# Patient Record
Sex: Female | Born: 1990 | Hispanic: Yes | Marital: Married | Smoking: Never smoker
Health system: Southern US, Community
[De-identification: ages and names within clinical notes are randomized; demographics above are authoritative.]

## PROBLEM LIST (undated history)

## (undated) ENCOUNTER — Inpatient Hospital Stay (HOSPITAL_COMMUNITY): Payer: Self-pay

## (undated) DIAGNOSIS — K37 Unspecified appendicitis: Secondary | ICD-10-CM

## (undated) DIAGNOSIS — F32A Depression, unspecified: Secondary | ICD-10-CM

## (undated) DIAGNOSIS — O009 Unspecified ectopic pregnancy without intrauterine pregnancy: Secondary | ICD-10-CM

## (undated) DIAGNOSIS — F329 Major depressive disorder, single episode, unspecified: Secondary | ICD-10-CM

---

## 2013-07-03 ENCOUNTER — Observation Stay (HOSPITAL_COMMUNITY)
Admission: EM | Admit: 2013-07-03 | Discharge: 2013-07-04 | Disposition: A | Discharge status: Home / Self Care | Payer: MEDICAID | Attending: General Surgery | Admitting: General Surgery

## 2013-07-03 ENCOUNTER — Encounter (HOSPITAL_COMMUNITY): Payer: Self-pay | Admitting: *Deleted

## 2013-07-03 ENCOUNTER — Emergency Department (HOSPITAL_COMMUNITY): Payer: Self-pay

## 2013-07-03 ENCOUNTER — Observation Stay (HOSPITAL_COMMUNITY): Payer: Self-pay | Admitting: Certified Registered"

## 2013-07-03 ENCOUNTER — Encounter (HOSPITAL_COMMUNITY)
Admission: EM | Disposition: A | Discharge status: Home / Self Care | Payer: Self-pay | Source: Home / Self Care | Attending: Emergency Medicine

## 2013-07-03 ENCOUNTER — Encounter (HOSPITAL_COMMUNITY): Payer: Self-pay | Admitting: Certified Registered"

## 2013-07-03 DIAGNOSIS — K3580 Unspecified acute appendicitis: Secondary | ICD-10-CM

## 2013-07-03 DIAGNOSIS — R1031 Right lower quadrant pain: Secondary | ICD-10-CM | POA: Insufficient documentation

## 2013-07-03 HISTORY — PX: LAPAROSCOPIC APPENDECTOMY: SHX408

## 2013-07-03 LAB — COMPREHENSIVE METABOLIC PANEL
ALT: 9 U/L (ref 0–35)
AST: 16 U/L (ref 0–37)
Alkaline Phosphatase: 62 U/L (ref 39–117)
CO2: 25 mEq/L (ref 19–32)
Calcium: 9.2 mg/dL (ref 8.4–10.5)
Chloride: 100 mEq/L (ref 96–112)
GFR calc Af Amer: >90 mL/min (ref >90)
GFR calc non Af Amer: >90 mL/min (ref >90)
Glucose, Bld: 97 mg/dL (ref 70–99)
Sodium: 136 mEq/L (ref 135–145)
Total Bilirubin: 0.3 mg/dL (ref 0.3–1.2)

## 2013-07-03 LAB — URINE MICROSCOPIC-ADD ON

## 2013-07-03 LAB — URINALYSIS, ROUTINE W REFLEX MICROSCOPIC
Bilirubin Urine: NEGATIVE
Glucose, UA: NEGATIVE mg/dL
Hgb urine dipstick: NEGATIVE
Ketones, ur: NEGATIVE mg/dL
Protein, ur: NEGATIVE mg/dL
Urobilinogen, UA: 1 mg/dL (ref 0.0–1.0)

## 2013-07-03 LAB — CBC WITH DIFFERENTIAL/PLATELET
Eosinophils Relative: 1 % (ref 0–5)
HCT: 40.1 % (ref 36.0–46.0)
Lymphocytes Relative: 14 % (ref 12–46)
Lymphs Abs: 1.7 10*3/uL (ref 0.7–4.0)
MCV: 86.4 fL (ref 78.0–100.0)
Neutro Abs: 9.6 10*3/uL — ABNORMAL HIGH (ref 1.7–7.7)
Platelets: 255 10*3/uL (ref 150–400)
RBC: 4.64 MIL/uL (ref 3.87–5.11)
WBC: 12.1 10*3/uL — ABNORMAL HIGH (ref 4.0–10.5)

## 2013-07-03 SURGERY — APPENDECTOMY, LAPAROSCOPIC
Anesthesia: General | Site: Abdomen | Wound class: Clean Contaminated

## 2013-07-03 MED ORDER — ROCURONIUM BROMIDE 100 MG/10ML IV SOLN
INTRAVENOUS | Status: DC | PRN
  Administered 2013-07-03: 30 mg via INTRAVENOUS

## 2013-07-03 MED ORDER — HYDROMORPHONE HCL PF 1 MG/ML IJ SOLN
0.2500 mg | INTRAMUSCULAR | Status: DC | PRN
  Administered 2013-07-03: 0.5 mg via INTRAVENOUS

## 2013-07-03 MED ORDER — OXYCODONE HCL 5 MG PO TABS
5.0000 mg | ORAL_TABLET | ORAL | Status: DC | PRN
Start: 2013-07-03 — End: 2013-07-04

## 2013-07-03 MED ORDER — FENTANYL CITRATE 0.05 MG/ML IJ SOLN
50.0000 ug | Freq: Once | INTRAMUSCULAR | Status: AC
  Administered 2013-07-03: 50 ug via INTRAVENOUS
  Filled 2013-07-03: qty 2

## 2013-07-03 MED ORDER — DIPHENHYDRAMINE HCL 50 MG/ML IJ SOLN: 12.5000 mg | Freq: Four times a day (QID) | INTRAMUSCULAR | Status: DC | PRN

## 2013-07-03 MED ORDER — IOHEXOL 300 MG/ML  SOLN
25.0000 mL | INTRAMUSCULAR | Status: DC
Start: 2013-07-03 — End: 2013-07-03
  Administered 2013-07-03: 25 mL via ORAL

## 2013-07-03 MED ORDER — NEOSTIGMINE METHYLSULFATE 1 MG/ML IJ SOLN
INTRAMUSCULAR | Status: DC | PRN
  Administered 2013-07-03: 4 mg via INTRAVENOUS

## 2013-07-03 MED ORDER — PROPOFOL 10 MG/ML IV BOLUS
INTRAVENOUS | Status: DC | PRN
  Administered 2013-07-03: 170 mg via INTRAVENOUS

## 2013-07-03 MED ORDER — KCL IN DEXTROSE-NACL 20-5-0.45 MEQ/L-%-% IV SOLN
INTRAVENOUS | Status: DC
  Administered 2013-07-03 – 2013-07-04 (×2): via INTRAVENOUS
  Filled 2013-07-03 (×3): qty 1000

## 2013-07-03 MED ORDER — FENTANYL CITRATE 0.05 MG/ML IJ SOLN
INTRAMUSCULAR | Status: DC | PRN
Start: 2013-07-03 — End: 2013-07-03
  Administered 2013-07-03: 100 ug via INTRAVENOUS
  Administered 2013-07-03 (×2): 50 ug via INTRAVENOUS

## 2013-07-03 MED ORDER — ONDANSETRON HCL 4 MG/2ML IJ SOLN: 4.0000 mg | Freq: Once | INTRAMUSCULAR | Status: DC | PRN

## 2013-07-03 MED ORDER — IOHEXOL 300 MG/ML  SOLN
100.0000 mL | Freq: Once | INTRAMUSCULAR | Status: AC | PRN
  Administered 2013-07-03: 100 mL via INTRAVENOUS

## 2013-07-03 MED ORDER — ONDANSETRON HCL 4 MG/2ML IJ SOLN
4.0000 mg | Freq: Four times a day (QID) | INTRAMUSCULAR | Status: DC | PRN
  Administered 2013-07-03: 4 mg via INTRAVENOUS

## 2013-07-03 MED ORDER — KETOROLAC TROMETHAMINE 30 MG/ML IJ SOLN
INTRAMUSCULAR | Status: DC | PRN
  Administered 2013-07-03: 30 mg via INTRAVENOUS

## 2013-07-03 MED ORDER — CEFAZOLIN SODIUM 1-5 GM-% IV SOLN
INTRAVENOUS | Status: AC
  Administered 2013-07-03 (×2): 1 g via INTRAVENOUS
  Filled 2013-07-03: qty 50

## 2013-07-03 MED ORDER — HYDROMORPHONE HCL PF 1 MG/ML IJ SOLN
INTRAMUSCULAR | Status: AC
  Administered 2013-07-03: 0.5 mg via INTRAVENOUS
  Filled 2013-07-03: qty 1

## 2013-07-03 MED ORDER — OXYCODONE HCL 5 MG/5ML PO SOLN: 5.0000 mg | Freq: Once | ORAL | Status: DC | PRN

## 2013-07-03 MED ORDER — CEFAZOLIN SODIUM 1-5 GM-% IV SOLN
1.0000 g | Freq: Four times a day (QID) | INTRAVENOUS | Status: DC
  Filled 2013-07-03 (×2): qty 50

## 2013-07-03 MED ORDER — ACETAMINOPHEN 650 MG RE SUPP: 650.0000 mg | Freq: Four times a day (QID) | RECTAL | Status: DC | PRN

## 2013-07-03 MED ORDER — DEXAMETHASONE SODIUM PHOSPHATE 4 MG/ML IJ SOLN
INTRAMUSCULAR | Status: DC | PRN
  Administered 2013-07-03: 4 mg via INTRAVENOUS

## 2013-07-03 MED ORDER — ONDANSETRON HCL 4 MG/2ML IJ SOLN
4.0000 mg | Freq: Once | INTRAMUSCULAR | Status: AC
  Administered 2013-07-03: 4 mg via INTRAVENOUS
  Filled 2013-07-03: qty 2

## 2013-07-03 MED ORDER — CEFAZOLIN SODIUM 1-5 GM-% IV SOLN
1.0000 g | Freq: Once | INTRAVENOUS | Status: AC
  Administered 2013-07-03: 1 g via INTRAVENOUS
  Filled 2013-07-03: qty 50

## 2013-07-03 MED ORDER — MORPHINE SULFATE 2 MG/ML IJ SOLN: 1.0000 mg | INTRAMUSCULAR | Status: DC | PRN

## 2013-07-03 MED ORDER — SODIUM CHLORIDE 0.9 % IV BOLUS (SEPSIS)
1000.0000 mL | Freq: Once | INTRAVENOUS | Status: AC
  Administered 2013-07-03: 1000 mL via INTRAVENOUS

## 2013-07-03 MED ORDER — CEFAZOLIN SODIUM 1-5 GM-% IV SOLN
INTRAVENOUS | Status: AC
  Filled 2013-07-03: qty 50

## 2013-07-03 MED ORDER — KETOROLAC TROMETHAMINE 30 MG/ML IJ SOLN: 15.0000 mg | Freq: Once | INTRAMUSCULAR | Status: DC | PRN

## 2013-07-03 MED ORDER — SODIUM CHLORIDE 0.9 % IR SOLN
Status: DC | PRN
  Administered 2013-07-03: 1000 mL

## 2013-07-03 MED ORDER — GLYCOPYRROLATE 0.2 MG/ML IJ SOLN
INTRAMUSCULAR | Status: DC | PRN
  Administered 2013-07-03: 0.6 mg via INTRAVENOUS

## 2013-07-03 MED ORDER — HYDROMORPHONE HCL PF 1 MG/ML IJ SOLN
1.0000 mg | Freq: Once | INTRAMUSCULAR | Status: AC
  Administered 2013-07-03: 1 mg via INTRAVENOUS
  Filled 2013-07-03: qty 1

## 2013-07-03 MED ORDER — ACETAMINOPHEN 325 MG PO TABS: 650.0000 mg | ORAL_TABLET | Freq: Four times a day (QID) | ORAL | Status: DC | PRN

## 2013-07-03 MED ORDER — METRONIDAZOLE IN NACL 5-0.79 MG/ML-% IV SOLN
500.0000 mg | Freq: Three times a day (TID) | INTRAVENOUS | Status: DC
  Administered 2013-07-03 – 2013-07-04 (×2): 500 mg via INTRAVENOUS
  Filled 2013-07-03 (×4): qty 100

## 2013-07-03 MED ORDER — OXYCODONE HCL 5 MG PO TABS: 5.0000 mg | ORAL_TABLET | Freq: Once | ORAL | Status: DC | PRN

## 2013-07-03 MED ORDER — HYDROMORPHONE HCL PF 1 MG/ML IJ SOLN: 0.2500 mg | INTRAMUSCULAR | Status: DC | PRN

## 2013-07-03 MED ORDER — SUCCINYLCHOLINE CHLORIDE 20 MG/ML IJ SOLN
INTRAMUSCULAR | Status: DC | PRN
  Administered 2013-07-03: 100 mg via INTRAVENOUS

## 2013-07-03 MED ORDER — ONDANSETRON HCL 4 MG/2ML IJ SOLN
4.0000 mg | Freq: Four times a day (QID) | INTRAMUSCULAR | Status: DC | PRN
  Filled 2013-07-03: qty 2

## 2013-07-03 MED ORDER — CEFAZOLIN SODIUM 1-5 GM-% IV SOLN
1.0000 g | Freq: Four times a day (QID) | INTRAVENOUS | Status: DC
  Administered 2013-07-04 (×2): 1 g via INTRAVENOUS
  Filled 2013-07-03 (×3): qty 50

## 2013-07-03 MED ORDER — LIDOCAINE HCL 4 % MT SOLN
OROMUCOSAL | Status: DC | PRN
  Administered 2013-07-03: 4 mL via TOPICAL

## 2013-07-03 MED ORDER — METRONIDAZOLE IN NACL 5-0.79 MG/ML-% IV SOLN
500.0000 mg | Freq: Once | INTRAVENOUS | Status: AC
  Administered 2013-07-03: 500 mg via INTRAVENOUS
  Filled 2013-07-03: qty 100

## 2013-07-03 MED ORDER — KCL IN DEXTROSE-NACL 20-5-0.45 MEQ/L-%-% IV SOLN
INTRAVENOUS | Status: DC
  Filled 2013-07-03 (×3): qty 1000

## 2013-07-03 MED ORDER — MIDAZOLAM HCL 5 MG/5ML IJ SOLN
INTRAMUSCULAR | Status: DC | PRN
  Administered 2013-07-03: 2 mg via INTRAVENOUS

## 2013-07-03 MED ORDER — PROMETHAZINE HCL 25 MG/ML IJ SOLN
6.2500 mg | INTRAMUSCULAR | Status: DC | PRN
  Filled 2013-07-03: qty 1

## 2013-07-03 MED ORDER — ONDANSETRON HCL 4 MG/2ML IJ SOLN
INTRAMUSCULAR | Status: DC | PRN
  Administered 2013-07-03: 4 mg via INTRAVENOUS

## 2013-07-03 MED ORDER — LACTATED RINGERS IV SOLN
INTRAVENOUS | Status: DC | PRN
  Administered 2013-07-03 (×2): via INTRAVENOUS

## 2013-07-03 MED ORDER — BUPIVACAINE-EPINEPHRINE 0.25% -1:200000 IJ SOLN
INTRAMUSCULAR | Status: DC | PRN
  Administered 2013-07-03: 10 mL

## 2013-07-03 MED ORDER — DIPHENHYDRAMINE HCL 12.5 MG/5ML PO ELIX: 12.5000 mg | ORAL_SOLUTION | Freq: Four times a day (QID) | ORAL | Status: DC | PRN

## 2013-07-03 MED ORDER — ALBUMIN HUMAN 5 % IV SOLN
INTRAVENOUS | Status: DC | PRN
  Administered 2013-07-03: 17:00:00 via INTRAVENOUS

## 2013-07-03 MED ORDER — PHENYLEPHRINE HCL 10 MG/ML IJ SOLN
INTRAMUSCULAR | Status: DC | PRN
  Administered 2013-07-03: 120 ug via INTRAVENOUS

## 2013-07-03 SURGICAL SUPPLY — 38 items
APPLIER CLIP ROT 10 11.4 M/L (STAPLE)
CANISTER SUCTION 2500CC (MISCELLANEOUS) ×2 IMPLANT
CHLORAPREP W/TINT 26ML (MISCELLANEOUS) ×2 IMPLANT
CLIP APPLIE ROT 10 11.4 M/L (STAPLE) IMPLANT
CLOTH BEACON ORANGE TIMEOUT ST (SAFETY) ×2 IMPLANT
COVER SURGICAL LIGHT HANDLE (MISCELLANEOUS) ×2 IMPLANT
CUTTER FLEX LINEAR 45M (STAPLE) ×2 IMPLANT
DERMABOND ADVANCED (GAUZE/BANDAGES/DRESSINGS) ×1
DERMABOND ADVANCED .7 DNX12 (GAUZE/BANDAGES/DRESSINGS) ×1 IMPLANT
ELECT REM PT RETURN 9FT ADLT (ELECTROSURGICAL) ×2
ELECTRODE REM PT RTRN 9FT ADLT (ELECTROSURGICAL) ×1 IMPLANT
GLOVE BIO SURGEON STRL SZ 6.5 (GLOVE) ×4 IMPLANT
GLOVE BIO SURGEON STRL SZ7 (GLOVE) ×2 IMPLANT
GLOVE BIOGEL PI IND STRL 6.5 (GLOVE) ×4 IMPLANT
GLOVE BIOGEL PI IND STRL 7.5 (GLOVE) ×1 IMPLANT
GLOVE BIOGEL PI INDICATOR 6.5 (GLOVE) ×4
GLOVE BIOGEL PI INDICATOR 7.5 (GLOVE) ×1
GLOVE SURG SS PI 6.0 STRL IVOR (GLOVE) ×2 IMPLANT
GOWN STRL NON-REIN LRG LVL3 (GOWN DISPOSABLE) ×8 IMPLANT
KIT BASIN OR (CUSTOM PROCEDURE TRAY) ×2 IMPLANT
KIT ROOM TURNOVER OR (KITS) ×2 IMPLANT
NS IRRIG 1000ML POUR BTL (IV SOLUTION) ×2 IMPLANT
PAD ARMBOARD 7.5X6 YLW CONV (MISCELLANEOUS) ×4 IMPLANT
POUCH SPECIMEN RETRIEVAL 10MM (ENDOMECHANICALS) ×2 IMPLANT
RELOAD 45 VASCULAR/THIN (ENDOMECHANICALS) ×2 IMPLANT
RELOAD STAPLE TA45 3.5 REG BLU (ENDOMECHANICALS) ×2 IMPLANT
SCALPEL HARMONIC ACE (MISCELLANEOUS) ×2 IMPLANT
SCISSORS LAP 5X35 DISP (ENDOMECHANICALS) IMPLANT
SET IRRIG TUBING LAPAROSCOPIC (IRRIGATION / IRRIGATOR) ×2 IMPLANT
SLEEVE ENDOPATH XCEL 5M (ENDOMECHANICALS) ×2 IMPLANT
SPECIMEN JAR SMALL (MISCELLANEOUS) ×2 IMPLANT
SUT MNCRL AB 4-0 PS2 18 (SUTURE) ×4 IMPLANT
TOWEL OR 17X24 6PK STRL BLUE (TOWEL DISPOSABLE) ×2 IMPLANT
TOWEL OR 17X26 10 PK STRL BLUE (TOWEL DISPOSABLE) ×2 IMPLANT
TRAY FOLEY CATH 16FRSI W/METER (SET/KITS/TRAYS/PACK) ×2 IMPLANT
TRAY LAPAROSCOPIC (CUSTOM PROCEDURE TRAY) ×2 IMPLANT
TROCAR XCEL BLUNT TIP 100MML (ENDOMECHANICALS) ×2 IMPLANT
TROCAR XCEL NON-BLD 5MMX100MML (ENDOMECHANICALS) ×2 IMPLANT

## 2013-07-03 NOTE — Anesthesia Preprocedure Evaluation (Addendum)
Anesthesia Evaluation  Patient identified by MRN, date of birth, ID band Patient awake and Patient confused    Reviewed: Allergy & Precautions, H&P , NPO status , Patient's Chart, lab work & pertinent test results  Airway Mallampati: I      Dental  (+) Teeth Intact and Dental Advisory Given   Pulmonary neg pulmonary ROS,  breath sounds clear to auscultation        Cardiovascular negative cardio ROS  Rhythm:Regular Rate:Normal     Neuro/Psych negative neurological ROS  negative psych ROS   GI/Hepatic negative GI ROS, Neg liver ROS,   Endo/Other  negative endocrine ROS  Renal/GU negative Renal ROS     Musculoskeletal   Abdominal   Peds  Hematology   Anesthesia Other Findings   Reproductive/Obstetrics negative OB ROS                         Anesthesia Physical Anesthesia Plan  ASA: II and emergent  Anesthesia Plan: General   Post-op Pain Management:    Induction: Intravenous  Airway Management Planned: Oral ETT  Additional Equipment:   Intra-op Plan:   Post-operative Plan: Extubation in OR  Informed Consent: I have reviewed the patients History and Physical, chart, labs and discussed the procedure including the risks, benefits and alternatives for the proposed anesthesia with the patient or authorized representative who has indicated his/her understanding and acceptance.   Dental advisory given  Plan Discussed with: CRNA and Anesthesiologist  Anesthesia Plan Comments: (Appendicitis Speaks no english  Plan GA with oral ETT  Kipp Brood, MD)        Anesthesia Quick Evaluation

## 2013-07-03 NOTE — ED Notes (Signed)
General Surgery at bedside.

## 2013-07-03 NOTE — Anesthesia Procedure Notes (Signed)
Procedure Name: Intubation Date/Time: 07/03/2013 4:30 PM Performed by: Orvilla Fus A Pre-anesthesia Checklist: Timeout performed, Patient identified, Emergency Drugs available, Suction available and Patient being monitored Patient Re-evaluated:Patient Re-evaluated prior to inductionOxygen Delivery Method: Circle system utilized Preoxygenation: Pre-oxygenation with 100% oxygen Intubation Type: IV induction, Rapid sequence and Cricoid Pressure applied Laryngoscope Size: Mac and 3 Grade View: Grade I Tube type: Oral Tube size: 7.0 mm Number of attempts: 1 Airway Equipment and Method: Stylet and LTA kit utilized Placement Confirmation: ETT inserted through vocal cords under direct vision,  breath sounds checked- equal and bilateral and positive ETCO2 Secured at: 21 cm Tube secured with: Tape Dental Injury: Teeth and Oropharynx as per pre-operative assessment

## 2013-07-03 NOTE — H&P (Signed)
Discussed lap appy, agree with above

## 2013-07-03 NOTE — ED Provider Notes (Signed)
History    CSN: [id number] Arrival date & time 07/03/13  9562  First MD Initiated Contact with Patient 07/03/13 0815     Chief Complaint  Patient presents with  . Abdominal Pain   (Consider location/radiation/quality/duration/timing/severity/associated sxs/prior Treatment) Patient is a 22 y.o. female presenting with abdominal pain.  Abdominal Pain Associated symptoms include abdominal pain.   Level 5 caveat due to language barrier, history through telephone interpreter.  Pt reports she woke up with pain in RLQ this morning around 3:30am, has moved to RUQ, associated with vomiting. No diarrhea, no dysuria today but some burning the last few days. She does not think she is pregnant and had neg hcg at home. She denies any vaginal bleeding or discharge.   History reviewed. No pertinent past medical history. History reviewed. No pertinent past surgical history. No family history on file. History  Substance Use Topics  . Smoking status: Never Smoker   . Smokeless tobacco: Never Used  . Alcohol Use: No   OB History   Grav Para Term Preterm Abortions TAB SAB Ect Mult Living                 Review of Systems  Gastrointestinal: Positive for abdominal pain.   Unable to assess due to language barrier  Allergies  Review of patient's allergies indicates no known allergies.  Home Medications  No current outpatient prescriptions on file. BP 105/63  Pulse 90  Temp(Src) 98 F (36.7 C) (Oral)  Resp 12  SpO2 100% Physical Exam  Nursing note and vitals reviewed. Constitutional: She is oriented to person, place, and time. She appears well-developed and well-nourished.  HENT:  Head: Normocephalic and atraumatic.  Eyes: EOM are normal. Pupils are equal, round, and reactive to light.  Neck: Normal range of motion. Neck supple.  Cardiovascular: Normal rate, normal heart sounds and intact distal pulses.   Pulmonary/Chest: Effort normal and breath sounds normal.  Abdominal: Soft. Bowel  sounds are normal. She exhibits no distension. There is tenderness (RLQ). There is guarding. There is no rebound.  Musculoskeletal: Normal range of motion. She exhibits no edema and no tenderness.  Neurological: She is alert and oriented to person, place, and time. She has normal strength. No cranial nerve deficit or sensory deficit.  Skin: Skin is warm and dry. No rash noted.  Psychiatric: She has a normal mood and affect.    ED Course  Procedures (including critical care time) Labs Reviewed  CBC WITH DIFFERENTIAL - Abnormal; Notable for the following:    WBC 12.1 (*)    Neutrophils Relative % 80 (*)    Neutro Abs 9.6 (*)    All other components within normal limits  URINALYSIS, ROUTINE W REFLEX MICROSCOPIC - Abnormal; Notable for the following:    APPearance CLOUDY (*)    Leukocytes, UA LARGE (*)    All other components within normal limits  URINE MICROSCOPIC-ADD ON - Abnormal; Notable for the following:    Squamous Epithelial / LPF MANY (*)    Bacteria, UA MANY (*)    All other components within normal limits  URINE CULTURE  COMPREHENSIVE METABOLIC PANEL  LIPASE, BLOOD  PREGNANCY, URINE   Ct Abdomen Pelvis W Contrast  07/03/2013   *RADIOLOGY REPORT*  Clinical Data: Lower quadrant pain rule out appendicitis. Miscarriage 3 weeks prior.  CT ABDOMEN AND PELVIS WITH CONTRAST  Technique:  Multidetector CT imaging of the abdomen and pelvis was performed following the standard protocol during bolus administration of intravenous contrast.  Contrast: OMNIPAQUE IOHEXOL 300 MG/ML  SOLN  Comparison: None  Findings: .  BODY WALL: Unremarkable.  LOWER CHEST:  Mediastinum: Unremarkable.  Lungs/pleura: No consolidation.  ABDOMEN/PELVIS:  Liver: No focal abnormality.  Biliary: No evidence of biliary obstruction or stone.  Pancreas: Unremarkable.  Spleen: Unremarkable.  Adrenals: Unremarkable.  Kidneys and ureters: No hydronephrosis or stone.  Bladder: Unremarkable.  Bowel: The appendix is dilated  at 12 mm outer wall diameter, and has thickened wall with hyperenhancement. The neighboring peritoneal reflections are thickened.  No nonenhancing portions of the wall or periappendiceal fluid collection.  Mild, reactive enlargement of ileocolic lymph nodes.  Retroperitoneum: No mass or adenopathy.  Peritoneum: No free fluid or gas.  Reproductive: Unremarkable.  Vascular: No acute abnormality.  OSSEOUS: No acute abnormalities. No suspicious lytic or blastic lesions.  Critical Value/emergent results were called by telephone at the time of interpretation on July 03, 2013. at 11:20 am. to Dr Bernette Mayers, who verbally acknowledged these results.  IMPRESSION: Acute, non-perforated appendicitis.   Original Report Authenticated By: Tiburcio Pea   1. Acute appendicitis     MDM  CT images reviewed with radiologist. Discussed these results with patient and family using telephone interpreter. Awaiting call-back from General Surgery.   Charles B. Bernette Mayers, MD 07/03/13 1153

## 2013-07-03 NOTE — ED Notes (Signed)
Pt reports rt upper and lower abdominal pain with vomiting. Pt reports burning with urination.

## 2013-07-03 NOTE — Op Note (Signed)
Preoperative diagnosis: Acute appendicitis Postoperative diagnosis: Same as above Procedure: Laparoscopic appendectomy Surgeon: Dr. Harden Mo Anesthesia: Gen. Estimated blood loss: Minimal Specimens: Appendix to pathology Complications: None Drains: None Sponge needle count correct x2 and of operation Disposition to recovery stable  Indications: This is a 22 year old female who presents with less than 24-hour history of right upper quadrant pain and a CT scan consistent with acute appendicitis. I discussed going to the operating room via translator to perform a laparoscopic appendectomy. We discussed the risks and benefits prior to beginning.  Procedure: After informed consent was obtained the patient was taken to the operating room. She was administered cefazolin and Flagyl. Sequential compression devices were her legs. She was placed under general anesthesia without complication. A Foley catheter was placed. She was then prepped and draped in the standard sterile surgical fashion. Surgical timeout was then performed.  I injected Marcaine below umbilicus. I made a vertical incision. I grasped her fashion and entered it sharply. I entered the peritoneum bluntly. I placed a 0 Vicryl pursestring suture through the fascia and then inserted a Hassan trocar. I then insufflated the abdomen to 15 mm mercury pressure. I then inserted 2 further 5 mm ports in the left lower quadrant and suprapubic region. I then identified her cecum and terminal ileum. Her appendix was not immediately visible. I used a harmonic scalpel to take down the white line and eventually the appendix was in a retroperitoneal position tracking along the ascending colon. It was very adherent to the colon. Eventually I was able to encircle the base and I divided this with a GIA stapler. I then spent some time dissecting the appendix away from both the small bowel as well as the colon. This was done with a combination of blunt  dissection as well as the harmonic scalpel. Once I had done this the appendix was free. It was placed in an Endo Catch bag and removed from the umbilicus. My staple line looked clean. There was a was hemostatic. I irrigated and this was clear. I then viewed both the staple line, cecum, and terminal ileum and there was no evidence of an injury. I then removed mass on trocar and tied my pursestring down. This completely obliterated the defect. I then desufflated the abdomen and remove our remaining trocars. I closed these with 4 Monocryl and Dermabond. She tolerated this well was extubated and transferred to recovery stable.

## 2013-07-03 NOTE — H&P (Signed)
Ebony Ryan 12-26-1990  [id number].   Chief Complaint/Reason for Consult: acute appendicitis HPI: This is a healthy 22 yo Hispanic female who awoke this morning at 3 am with abdominal pain.  It had migrated to the RLQ.  She has had some nausea and emesis.  She admits to subjective fever at home.  She has had 2 BMs, no diarrhea.  She presented to [facility] today and had a CT scan that revealed acute appendicitis.  We were called to see the patient for admission.  Review of Systems: Please see HPI, otherwise all other systems are negative  No family history on file.  History reviewed. No pertinent past medical history.  History reviewed. No pertinent past surgical history.  Social History:  reports that she has never smoked. She has never used smokeless tobacco. She reports that she does not drink alcohol or use illicit drugs.  Allergies: No Known Allergies   (Not in a hospital admission)  Blood pressure 89/52, pulse 89, temperature 98 F (36.7 C), temperature source Oral, resp. rate 12, last menstrual period 05/25/2013, SpO2 97.00%. Physical Exam: General: pleasant, WD, WN Hispanic female who is laying in bed in NAD HEENT: head is normocephalic, atraumatic.  Sclera are noninjected.  PERRL.  Ears and nose without any masses or lesions.  Mouth is pink and moist Heart: regular, rate, and rhythm.  Normal s1,s2. No obvious murmurs, gallops, or rubs noted.  Palpable radial and pedal pulses bilaterally Lungs: CTAB, no wheezes, rhonchi, or rales noted.  Respiratory effort nonlabored Abd: soft, mild tenderness in RLQ, ND, hypoactive BS, no masses, hernias, or organomegaly MS: all 4 extremities are symmetrical with no cyanosis, clubbing, or edema. Skin: warm and dry with no masses, lesions, or rashes Psych: A&Ox3 with an appropriate affect.    Results for orders placed during the hospital encounter of 07/03/13 (from the past 48 hour(s))  URINALYSIS, ROUTINE W REFLEX MICROSCOPIC      Status: Abnormal   Collection Time    07/03/13  8:34 AM      Result Value Range   Color, Urine YELLOW  YELLOW   APPearance CLOUDY (*) CLEAR   Specific Gravity, Urine 1.024  1.005 - 1.030   pH 7.0  5.0 - 8.0   Glucose, UA NEGATIVE  NEGATIVE mg/dL   Hgb urine dipstick NEGATIVE  NEGATIVE   Bilirubin Urine NEGATIVE  NEGATIVE   Ketones, ur NEGATIVE  NEGATIVE mg/dL   Protein, ur NEGATIVE  NEGATIVE mg/dL   Urobilinogen, UA 1.0  0.0 - 1.0 mg/dL   Nitrite NEGATIVE  NEGATIVE   Leukocytes, UA LARGE (*) NEGATIVE  PREGNANCY, URINE     Status: None   Collection Time    07/03/13  8:34 AM      Result Value Range   Preg Test, Ur NEGATIVE  NEGATIVE   Comment:            THE SENSITIVITY OF THIS     METHODOLOGY IS >20 mIU/mL.  URINE MICROSCOPIC-ADD ON     Status: Abnormal   Collection Time    07/03/13  8:34 AM      Result Value Range   Squamous Epithelial / LPF MANY (*) RARE   WBC, UA 11-20  <3 WBC/hpf   RBC / HPF 0-2  <3 RBC/hpf   Bacteria, UA MANY (*) RARE   Urine-Other MUCOUS PRESENT    CBC WITH DIFFERENTIAL     Status: Abnormal   Collection Time    07/03/13  8:45 AM  Result Value Range   WBC 12.1 (*) 4.0 - 10.5 K/uL   RBC 4.64  3.87 - 5.11 MIL/uL   Hemoglobin 13.8  12.0 - 15.0 g/dL   HCT 32.9  51.8 - 84.1 %   MCV 86.4  78.0 - 100.0 fL   MCH 29.7  26.0 - 34.0 pg   MCHC 34.4  30.0 - 36.0 g/dL   RDW 66.0  63.0 - 16.0 %   Platelets 255  150 - 400 K/uL   Neutrophils Relative % 80 (*) 43 - 77 %   Neutro Abs 9.6 (*) 1.7 - 7.7 K/uL   Lymphocytes Relative 14  12 - 46 %   Lymphs Abs 1.7  0.7 - 4.0 K/uL   Monocytes Relative 6  3 - 12 %   Monocytes Absolute 0.7  0.1 - 1.0 K/uL   Eosinophils Relative 1  0 - 5 %   Eosinophils Absolute 0.1  0.0 - 0.7 K/uL   Basophils Relative 0  0 - 1 %   Basophils Absolute 0.0  0.0 - 0.1 K/uL  COMPREHENSIVE METABOLIC PANEL     Status: None   Collection Time    07/03/13  8:45 AM      Result Value Range   Sodium 136  135 - 145 mEq/L   Potassium 3.5   3.5 - 5.1 mEq/L   Chloride 100  96 - 112 mEq/L   CO2 25  19 - 32 mEq/L   Glucose, Bld 97  70 - 99 mg/dL   BUN 11  6 - 23 mg/dL   Creatinine, Ser 1.09  0.50 - 1.10 mg/dL   Calcium 9.2  8.4 - 32.3 mg/dL   Total Protein 7.8  6.0 - 8.3 g/dL   Albumin 4.1  3.5 - 5.2 g/dL   AST 16  0 - 37 U/L   ALT 9  0 - 35 U/L   Alkaline Phosphatase 62  39 - 117 U/L   Total Bilirubin 0.3  0.3 - 1.2 mg/dL   GFR calc non Af Amer >90  >90 mL/min   GFR calc Af Amer >90  >90 mL/min   Comment:            The eGFR has been calculated     using the CKD EPI equation.     This calculation has not been     validated in all clinical     situations.     eGFR's persistently     <90 mL/min signify     possible Chronic Kidney Disease.  LIPASE, BLOOD     Status: None   Collection Time    07/03/13  8:45 AM      Result Value Range   Lipase 44  11 - 59 U/L   Ct Abdomen Pelvis W Contrast  07/03/2013   *RADIOLOGY REPORT*  Clinical Data: Lower quadrant pain rule out appendicitis. Miscarriage 3 weeks prior.  CT ABDOMEN AND PELVIS WITH CONTRAST  Technique:  Multidetector CT imaging of the abdomen and pelvis was performed following the standard protocol during bolus administration of intravenous contrast.  Contrast: OMNIPAQUE IOHEXOL 300 MG/ML  SOLN  Comparison: None  Findings: .  BODY WALL: Unremarkable.  LOWER CHEST:  Mediastinum: Unremarkable.  Lungs/pleura: No consolidation.  ABDOMEN/PELVIS:  Liver: No focal abnormality.  Biliary: No evidence of biliary obstruction or stone.  Pancreas: Unremarkable.  Spleen: Unremarkable.  Adrenals: Unremarkable.  Kidneys and ureters: No hydronephrosis or stone.  Bladder: Unremarkable.  Bowel: The appendix  is dilated at 12 mm outer wall diameter, and has thickened wall with hyperenhancement. The neighboring peritoneal reflections are thickened.  No nonenhancing portions of the wall or periappendiceal fluid collection.  Mild, reactive enlargement of ileocolic lymph nodes.  Retroperitoneum:  No mass or adenopathy.  Peritoneum: No free fluid or gas.  Reproductive: Unremarkable.  Vascular: No acute abnormality.  OSSEOUS: No acute abnormalities. No suspicious lytic or blastic lesions.  Critical Value/emergent results were called by telephone at the time of interpretation on July 03, 2013. at 11:20 am. to Dr Bernette Mayers, who verbally acknowledged these results.  IMPRESSION: Acute, non-perforated appendicitis.   Original Report Authenticated By: Tiburcio Pea       Assessment/Plan 1. Acute appendicitis  Plan: 1. Will admit for surgery later today for acute appendicitis.  This has been discussed with the patient and her husband via an interpretor.  They understand and are willing to proceed.  The surgery, risks and complications, along with expected recovery has been explained.  Ancef and flagyl will be started.  Annaliz Aven E 07/03/2013, 1:41 PM Pager: [phone]

## 2013-07-03 NOTE — Transfer of Care (Signed)
Immediate Anesthesia Transfer of Care Note  Patient: Ebony Ryan  Procedure(s) Performed: Procedure(s): APPENDECTOMY LAPAROSCOPIC (N/A)  Patient Location: PACU  Anesthesia Type:General  Level of Consciousness: awake and alert   Airway & Oxygen Therapy: Patient Spontanous Breathing and Patient connected to nasal cannula oxygen  Post-op Assessment: Report given to PACU RN, Post -op Vital signs reviewed and stable and Patient moving all extremities X 4  Post vital signs: Reviewed and stable  Complications: No apparent anesthesia complications

## 2013-07-03 NOTE — ED Notes (Signed)
Patient transported to CT 

## 2013-07-04 ENCOUNTER — Encounter (HOSPITAL_COMMUNITY): Payer: Self-pay | Admitting: General Surgery

## 2013-07-04 LAB — URINE CULTURE

## 2013-07-04 MED ORDER — HYDROCODONE-ACETAMINOPHEN 5-325 MG PO TABS: 1.0000 | ORAL_TABLET | ORAL | Status: DC | PRN

## 2013-07-04 NOTE — Progress Notes (Signed)
Pt discharged to home

## 2013-07-04 NOTE — Discharge Summary (Signed)
Physician Discharge Summary  Patient ID: Ebony Ryan MRN: [id number] DOB/AGE: [birth year] 21 y.o.  Admit date: 07/03/2013 Discharge date: 07/04/2013  Discharge Diagnoses Patient Active Problem List   Diagnosis Date Noted  . Acute appendicitis 07/03/2013    Consultants None  Procedures Laparoscopic appendectomy by Dr. Harden Mo   HPI: This is a healthy 22 yo Hispanic female who awoke the morning of admission at 3 am with abdominal pain. It had migrated to the right lower quadrant. She had some nausea and emesis. She admitted to a subjective fever at home. She had 2 bowel movements but no diarrhea. She presented to the emergency department and had a CT scan that revealed acute appendicitis.    Hospital Course: The patient was taken later that day for an uncomplicated laparoscopic appendectomy. Her postoperative course was uneventful. Her pain was controlled and she was tolerating a diet. She was discharged home in improved condition.      Medication List         HYDROcodone-acetaminophen 5-325 MG per tablet  Commonly known as:  NORCO  Take 1-2 tablets by mouth every 4 (four) hours as needed for pain.     prenatal multivitamin Tabs  Take 1 tablet by mouth daily at 12 noon.             Follow-up Information   Follow up with [facility] On 07/22/2013. (12:45PM)    Contact information:   [address] [place] [address] [phone]       Signed: Freeman Caldron, PA-C Pager: [phone] General Trauma PA Pager: [phone]  07/04/2013, 8:48 AM

## 2013-07-04 NOTE — Progress Notes (Signed)
Agree with above, dc today

## 2013-07-04 NOTE — Anesthesia Postprocedure Evaluation (Signed)
Anesthesia Post Note  Patient: Ebony Ryan  Procedure(s) Performed: Procedure(s) (LRB): APPENDECTOMY LAPAROSCOPIC (N/A)  Anesthesia type: general  Patient location: PACU  Post pain: Pain level controlled  Post assessment: Patient's Cardiovascular Status Stable  Last Vitals:  Filed Vitals:   07/04/13 0555  BP: 90/49  Pulse: 50  Temp: 37.2 C  Resp: 16    Post vital signs: Reviewed and stable  Level of consciousness: sedated  Complications: No apparent anesthesia complications

## 2013-07-04 NOTE — Progress Notes (Signed)
Patient ID: Ebony Ryan, female   DOB: January 16, 1991, 22 y.o.   MRN: [id number]   LOS: 1 day  POD#1  Subjective: Denies N/V. Some pain with deep breathing.   Objective: Vital signs in last 24 hours: Temp:  [98.6 F (37 C)-99 F (37.2 C)] 99 F (37.2 C) (07/11 0555) Pulse Rate:  [50-102] 50 (07/11 0555) Resp:  [14-18] 16 (07/11 0555) BP: (85-100)/(44-61) 90/49 mmHg (07/11 0555) SpO2:  [97 %-100 %] 100 % (07/11 0555) Weight:  [138 lb 4.8 oz (62.732 kg)] 138 lb 4.8 oz (62.732 kg) (07/10 1850)    Physical Exam General appearance: alert and no distress Resp: clear to auscultation bilaterally Cardio: regular rate and rhythm GI: Soft, +BS, port sites C/D/I   Assessment/Plan: Acute appendicitis s/p lap appy -- Home today   Freeman Caldron, PA-C Pager: [phone] 07/04/2013

## 2013-07-22 ENCOUNTER — Ambulatory Visit (INDEPENDENT_AMBULATORY_CARE_PROVIDER_SITE_OTHER): Payer: Self-pay | Admitting: Internal Medicine

## 2013-07-22 ENCOUNTER — Encounter (INDEPENDENT_AMBULATORY_CARE_PROVIDER_SITE_OTHER): Payer: Self-pay | Admitting: Internal Medicine

## 2013-07-22 ENCOUNTER — Encounter (INDEPENDENT_AMBULATORY_CARE_PROVIDER_SITE_OTHER): Payer: Self-pay | Admitting: General Surgery

## 2013-07-22 VITALS — BP 92/64 | HR 64 | Temp 98.4°F | Resp 14 | Ht 63.5 in | Wt 129.8 lb

## 2013-07-22 DIAGNOSIS — K3580 Unspecified acute appendicitis: Secondary | ICD-10-CM

## 2013-07-22 NOTE — Patient Instructions (Signed)
May resume regular activity without restrictions. Follow up as needed. Call with questions or concerns.  

## 2013-07-22 NOTE — Progress Notes (Signed)
  Subjective: Pt returns to the clinic today after undergoing laparoscopic appendectomy on 07/03/13 by Dr. Dwain Sarna.  The patient is tolerating their diet well and is having no severe pain.  Bowel function is good.  No problems with the wounds.  Objective: Vital signs in last 24 hours: Reviewed  PE: Abd: soft, non-tender, +bs, incisions well healed  Lab Results:  No results found for this basename: WBC, HGB, HCT, PLT,  in the last 72 hours BMET No results found for this basename: NA, K, CL, CO2, GLUCOSE, BUN, CREATININE, CALCIUM,  in the last 72 hours PT/INR No results found for this basename: LABPROT, INR,  in the last 72 hours CMP     Component Value Date/Time   NA 136 07/03/2013 0845   K 3.5 07/03/2013 0845   CL 100 07/03/2013 0845   CO2 25 07/03/2013 0845   GLUCOSE 97 07/03/2013 0845   BUN 11 07/03/2013 0845   CREATININE 0.66 07/03/2013 0845   CALCIUM 9.2 07/03/2013 0845   PROT 7.8 07/03/2013 0845   ALBUMIN 4.1 07/03/2013 0845   AST 16 07/03/2013 0845   ALT 9 07/03/2013 0845   ALKPHOS 62 07/03/2013 0845   BILITOT 0.3 07/03/2013 0845   GFRNONAA >90 07/03/2013 0845   GFRAA >90 07/03/2013 0845   Lipase     Component Value Date/Time   LIPASE 44 07/03/2013 0845       Studies/Results: No results found.  Anti-infectives: Anti-infectives   None       Assessment/Plan  1.  S/P Laparoscopic Appendectomy: doing well, may resume regular activity without restrictions, Pt will follow up with [place] PRN and knows to call with questions or concerns.     WHITE, ELIZABETH 07/22/2013

## 2014-06-01 ENCOUNTER — Inpatient Hospital Stay (HOSPITAL_COMMUNITY)
Admission: AD | Admit: 2014-06-01 | Discharge: 2014-06-02 | Disposition: A | Discharge status: Home / Self Care | Payer: No Typology Code available for payment source | Source: Ambulatory Visit | Attending: Obstetrics and Gynecology | Admitting: Obstetrics and Gynecology

## 2014-06-01 ENCOUNTER — Encounter (HOSPITAL_COMMUNITY): Payer: Self-pay

## 2014-06-01 DIAGNOSIS — O219 Vomiting of pregnancy, unspecified: Secondary | ICD-10-CM

## 2014-06-01 DIAGNOSIS — O210 Mild hyperemesis gravidarum: Secondary | ICD-10-CM | POA: Insufficient documentation

## 2014-06-01 LAB — URINALYSIS, ROUTINE W REFLEX MICROSCOPIC
BILIRUBIN URINE: NEGATIVE
GLUCOSE, UA: NEGATIVE mg/dL
HGB URINE DIPSTICK: NEGATIVE
KETONES UR: NEGATIVE mg/dL
LEUKOCYTES UA: NEGATIVE
Nitrite: NEGATIVE
PH: 7 (ref 5.0–8.0)
Protein, ur: NEGATIVE mg/dL
Specific Gravity, Urine: 1.01 (ref 1.005–1.030)
Urobilinogen, UA: 0.2 mg/dL (ref 0.0–1.0)

## 2014-06-01 LAB — POCT PREGNANCY, URINE: Preg Test, Ur: POSITIVE — AB

## 2014-06-01 MED ORDER — PROMETHAZINE HCL 25 MG RE SUPP: 25.0000 mg | Freq: Four times a day (QID) | RECTAL | Status: DC | PRN

## 2014-06-01 MED ORDER — METOCLOPRAMIDE HCL 5 MG/ML IJ SOLN
10.0000 mg | Freq: Once | INTRAMUSCULAR | Status: AC
  Administered 2014-06-02: 10 mg via INTRAMUSCULAR
  Filled 2014-06-01: qty 2

## 2014-06-01 MED ORDER — PROMETHAZINE HCL 25 MG PO TABS: 25.0000 mg | ORAL_TABLET | Freq: Four times a day (QID) | ORAL | Status: DC | PRN

## 2014-06-01 MED ORDER — PROMETHAZINE HCL 25 MG PO TABS
25.0000 mg | ORAL_TABLET | Freq: Once | ORAL | Status: AC
  Administered 2014-06-01: 25 mg via ORAL
  Filled 2014-06-01: qty 1

## 2014-06-01 MED ORDER — METOCLOPRAMIDE HCL 10 MG PO TABS: 10.0000 mg | ORAL_TABLET | Freq: Once | ORAL | Status: DC

## 2014-06-01 NOTE — Discharge Instructions (Signed)
Náuseas matinales  ( Morning Sickness)  Se denominan náuseas matinales a las ganas de vomitar (náuseas) durante el embarazo. Comienza a sentir náuseas y devuelve (vomita). Siente náuseas por la mañana, pero puede continuar sintiéndolas durante todo el día. Algunas mujeres experimentan náuseas intensas y no pueden detener los vómitos (hiperemesis gravídica).  CUIDADOS EN EL HOGAR  · Tome sólo los medicamentos según le haya indicado el médico.  · Tome las multivitaminas como le indicó el médico. Tomar multivitaminas antes de quedar embarazada puede detener o disminuir el malestar de las náuseas matinales.  · Coma un trozo de tostada seca o crackers sin sal antes de levantarse de la cama por la mañana.  · Coma 5 o 6 comidas pequeñas por día.  · Consuma alimentos blandos y secos como arroz y patatas asadas.  · No  beba líquidos con las comidas. Tome líquidos entre las comidas.  · No coma alimentos fritos, grasos o muy condimentados.  · Pídale a alguna persona que cocine para usted, si el olor de las comidas le da náuseas o ganas de vomitar.  · Si tiene ganas de vomitar después de tomar las vitaminas prenatales, tómelas a la noche o con una colación.  · Consuma proteínas cuando necesite una colación (frutas secas, yogur, queso).  · Coma gelatina sin azúcar de postre.  · Use un brazalete de los usados para el mareo marítimo (pulsera de acupresión).  · Vaya a un especialista en colocar agujas en puntos seleccionados del cuerpo (acupuntura)para sentirse mejor.  · No fume.  · Consiga un humidificador para mantener el aire de su casa libre de olores.  · Trate de respirar aire fresco.  SOLICITE AYUDA SI:  · Necesita medicamentos para sentirse mejor.  · Se siente mareada o sufre un desmayo.  · Pierde peso.  SOLICITE AYUDA DE INMEDIATO SI:   · Tiene malestar estomacal y no puede dejar de vomitar.  · Pierde el conocimiento (se desmaya).  Document Released: 12/11/2005 Document Revised: 08/13/2013  ExitCare® Patient Information  ©2014 ExitCare, LLC.

## 2014-06-01 NOTE — MAU Provider Note (Signed)
First Provider Initiated Contact with Patient 06/01/2014 at 2200.  Chief Complaint:  Possible Pregnancy, Emesis During Pregnancy and Morning Sickness   Ebony Ryan is  23 y.o. G1P0 at [redacted]w[redacted]d presents complaining of Possible Pregnancy, Emesis During Pregnancy and Morning Sickness [facility] pt reports that she has been having nausea and vomiting for 4 weeks. Pt reports that she has 5-8 episodes of emesis. Pt reports that usually able to keep some food down. Pt reports that she is lightheaded/dizzy when she vomitting but not when standing.   Pt had a ectopic pregnancy a year ago. Pt tried imetrol? For nausea and helped a little with n/v Denies vaginal bleeding or discharge. No ctx.  Obstetrical/Gynecological History: OB History   Grav Para Term Preterm Abortions TAB SAB Ect Mult Living   2    1   1        Past Medical History: History reviewed. No pertinent past medical history.  Past Surgical History: Past Surgical History  Procedure Laterality Date  . Laparoscopic appendectomy N/A 07/03/2013    Procedure: APPENDECTOMY LAPAROSCOPIC;  Surgeon: Emelia LoronMatthew Wakefield, MD;  [facility];  Service: General;  Laterality: N/A;    Family History: History reviewed. No pertinent family history.  Social History: History  Substance Use Topics  . Smoking status: Never Smoker   . Smokeless tobacco: Never Used  . Alcohol Use: No    Allergies: No Known Allergies  Meds:  Prescriptions prior to admission  Medication Sig Dispense Refill  . anti-nausea (EMETROL) solution Take 15 mLs by mouth 2 (two) times daily.      . Multiple Vitamins-Minerals (MULTIVITAMIN PO) Take 1 tablet by mouth daily.        Review of Systems -   Review of Systems  Denies headaches, vision changes, cp, sob, occassional loose stool. No issues with urination, increased spitting after vomiting.   Physical Exam  Blood pressure 102/62, pulse 85, temperature 98.5 F (36.9  C), temperature source Oral, resp. rate 18, height 5\' 3"  (1.6 m), weight 58.514 kg (129 lb), last menstrual period 03/28/2014. GENERAL: Well-developed, well-nourished female in no acute distress.  LUNGS: Clear to auscultation bilaterally.  HEART: Regular rate and rhythm. ABDOMEN: Soft, nontender, nondistended, gravid.  EXTREMITIES: Nontender, no edema, 2+ distal pulses. DTR's 2+  Labs: Results for orders placed during the hospital encounter of 06/01/14 (from the past 24 hour(s))  URINALYSIS, ROUTINE W REFLEX MICROSCOPIC   Collection Time    06/01/14  9:35 PM      Result Value Ref Range   Color, Urine YELLOW  YELLOW   APPearance CLEAR  CLEAR   Specific Gravity, Urine 1.010  1.005 - 1.030   pH 7.0  5.0 - 8.0   Glucose, UA NEGATIVE  NEGATIVE mg/dL   Hgb urine dipstick NEGATIVE  NEGATIVE   Bilirubin Urine NEGATIVE  NEGATIVE   Ketones, ur NEGATIVE  NEGATIVE mg/dL   Protein, ur NEGATIVE  NEGATIVE mg/dL   Urobilinogen, UA 0.2  0.0 - 1.0 mg/dL   Nitrite NEGATIVE  NEGATIVE   Leukocytes, UA NEGATIVE  NEGATIVE  POCT PREGNANCY, URINE   Collection Time    06/01/14 10:13 PM      Result Value Ref Range   Preg Test, Ur POSITIVE (*) NEGATIVE   MAU COURSE:  Ryna Odom MD turned over care of pt to Ebony Ryan, CMN at 2220. Phenergan given.  Improved with phenergan, but then pt vomiting when she was being discharged.  Reglan IM ordered.   Vomiting again. IV bolus and Zofran ordered.  Feeling much better. No vomiting.    1. Nausea and vomiting of pregnancy, antepartum    Plan: Discharge him in stable condition. Advance diet slowly. Encouraged using Phenergan on a schedule for now and Zofran sparingly as needed. Discussed concerns about Zofran use in early pregnancy and weighed against concerns concerned about the patient's failure of other antiemetics. Follow-up Information   Follow up with [facility] [place]. (As Previously Scheduled)    Contact information:   [address]  [place] [address] [phone]       Medication List         anti-nausea solution  Take 15 mLs by mouth 2 (two) times daily.     famotidine 20 MG tablet  Commonly known as:  PEPCID  Take 1 tablet (20 mg total) by mouth 2 (two) times daily.     MULTIVITAMIN PO  Take 1 tablet by mouth daily.     ondansetron 4 MG tablet  Commonly known as:  ZOFRAN  Take 1 tablet (4 mg total) by mouth every 8 (eight) hours as needed for nausea or vomiting.     promethazine 25 MG tablet  Commonly known as:  PHENERGAN  Take 1 tablet (25 mg total) by mouth every 6 (six) hours as needed for nausea or vomiting.     promethazine 25 MG suppository  Commonly known as:  PHENERGAN  Place 1 suppository (25 mg total) rectally every 6 (six) hours as needed for nausea or vomiting.       Dorathy Kinsman 6/8/201510:58 PM

## 2014-06-01 NOTE — MAU Provider Note (Signed)
None     Chief Complaint:  Possible Pregnancy, Emesis During Pregnancy and Morning Sickness   Ebony Ryan is  23 y.o. G1P0 at [redacted]w[redacted]d presents complaining of Possible Pregnancy, Emesis During Pregnancy and Morning Sickness [facility] - pt reports that she has been having nausea and vomiting for 4 weeks. Pt reports that she has 5-8 episodes of emesis. Pt reports that usually able to keep some food down. Pt reports that she is lightheaded/dizzy when she vomitting but not when standing.   Pt had a ectopic pregnancy a year ago. Pt tried imetrol? For nausea and helped a little with n/v Denies vaginal bleeding or discharge. No ctx.  Obstetrical/Gynecological History: OB History   Grav Para Term Preterm Abortions TAB SAB Ect Mult Living   1              Past Medical History: No past medical history on file.  Past Surgical History: Past Surgical History  Procedure Laterality Date  . Laparoscopic appendectomy N/A 07/03/2013    Procedure: APPENDECTOMY LAPAROSCOPIC;  Surgeon: Emelia Loron, MD;  [facility] OR;  Service: General;  Laterality: N/A;    Family History: No family history on file.  Social History: History  Substance Use Topics  . Smoking status: Never Smoker   . Smokeless tobacco: Never Used  . Alcohol Use: No    Allergies: No Known Allergies  Meds:  No prescriptions prior to admission    Review of Systems -   Review of Systems  Denies headaches, vision changes, cp, sob, occassional loose stool. No issues with urination, increased spitting after vomiting.   Physical Exam  Height 5\' 3"  (1.6 m), weight 58.514 kg (129 lb), last menstrual period 03/28/2014. GENERAL: Well-developed, well-nourished female in no acute distress.  LUNGS: Clear to auscultation bilaterally.  HEART: Regular rate and rhythm. ABDOMEN: Soft, nontender, nondistended, gravid.  EXTREMITIES: Nontender, no edema, 2+ distal pulses. DTR's  2+  Labs: No results found for this or any previous visit (from the past 24 hour(s)). Imaging Studies:  No results found.  Assessment: Ebony Ryan is  23 y.o. G1P0 at [redacted]w[redacted]d presents with nausea and vomiting vs hyperemsis.  Pt given phenergan and handed over to on call provider at end of shift pending improvement.   Minta Balsam 6/8/20159:47 PM

## 2014-06-02 MED ORDER — ONDANSETRON 8 MG/NS 50 ML IVPB
8.0000 mg | Freq: Once | INTRAVENOUS | Status: AC
  Administered 2014-06-02: 8 mg via INTRAVENOUS
  Filled 2014-06-02: qty 8

## 2014-06-02 MED ORDER — FAMOTIDINE 20 MG PO TABS: 20.0000 mg | ORAL_TABLET | Freq: Two times a day (BID) | ORAL | Status: DC

## 2014-06-02 MED ORDER — LACTATED RINGERS IV BOLUS (SEPSIS)
1000.0000 mL | Freq: Once | INTRAVENOUS | Status: AC
  Administered 2014-06-02: 1000 mL via INTRAVENOUS

## 2014-06-02 MED ORDER — ONDANSETRON HCL 4 MG PO TABS: 4.0000 mg | ORAL_TABLET | Freq: Three times a day (TID) | ORAL | Status: DC | PRN

## 2014-06-03 NOTE — MAU Provider Note (Signed)
Attestation of Attending Supervision of Advanced Practitioner (CNM/NP): Evaluation and management procedures were performed by the Advanced Practitioner under my supervision and collaboration.  I have reviewed the Advanced Practitioner's note and chart, and I agree with the management and plan.  Denissa Cozart 06/03/2014 4:07 PM   

## 2014-06-03 NOTE — MAU Provider Note (Signed)
Attestation of Attending Supervision of Advanced Practitioner (CNM/NP): Evaluation and management procedures were performed by the Advanced Practitioner under my supervision and collaboration.  I have reviewed the Advanced Practitioner's note and chart, and I agree with the management and plan.  Perri Aragones 06/03/2014 4:07 PM

## 2014-06-04 LAB — OB RESULTS CONSOLE HEPATITIS B SURFACE ANTIGEN: HEP B S AG: NEGATIVE

## 2014-06-04 LAB — OB RESULTS CONSOLE GC/CHLAMYDIA
Chlamydia: NEGATIVE
GC PROBE AMP, GENITAL: NEGATIVE

## 2014-06-04 LAB — OB RESULTS CONSOLE RUBELLA ANTIBODY, IGM: Rubella: IMMUNE

## 2014-10-26 ENCOUNTER — Encounter (HOSPITAL_COMMUNITY): Payer: Self-pay

## 2014-12-07 LAB — OB RESULTS CONSOLE GBS: STREP GROUP B AG: POSITIVE

## 2014-12-23 ENCOUNTER — Emergency Department (HOSPITAL_COMMUNITY)
Admission: EM | Admit: 2014-12-23 | Discharge: 2014-12-24 | Disposition: A | Discharge status: Home / Self Care | Payer: Medicaid Other | Source: Home / Self Care | Attending: Emergency Medicine | Admitting: Emergency Medicine

## 2014-12-23 DIAGNOSIS — R109 Unspecified abdominal pain: Secondary | ICD-10-CM | POA: Insufficient documentation

## 2014-12-23 DIAGNOSIS — Z3A38 38 weeks gestation of pregnancy: Secondary | ICD-10-CM

## 2014-12-23 DIAGNOSIS — Z79899 Other long term (current) drug therapy: Secondary | ICD-10-CM

## 2014-12-23 DIAGNOSIS — O99824 Streptococcus B carrier state complicating childbirth: Secondary | ICD-10-CM | POA: Diagnosis present

## 2014-12-23 DIAGNOSIS — O411230 Chorioamnionitis, third trimester, not applicable or unspecified: Secondary | ICD-10-CM | POA: Diagnosis present

## 2014-12-23 DIAGNOSIS — O471 False labor at or after 37 completed weeks of gestation: Secondary | ICD-10-CM | POA: Insufficient documentation

## 2014-12-23 DIAGNOSIS — Z3493 Encounter for supervision of normal pregnancy, unspecified, third trimester: Secondary | ICD-10-CM

## 2014-12-23 DIAGNOSIS — O479 False labor, unspecified: Secondary | ICD-10-CM

## 2014-12-24 ENCOUNTER — Inpatient Hospital Stay (HOSPITAL_COMMUNITY)
Admission: AD | Admit: 2014-12-24 | Discharge: 2014-12-24 | Disposition: A | Discharge status: Home / Self Care | Payer: Medicaid Other | Source: Ambulatory Visit | Attending: Obstetrics & Gynecology | Admitting: Obstetrics & Gynecology

## 2014-12-24 ENCOUNTER — Inpatient Hospital Stay (HOSPITAL_COMMUNITY): Payer: Medicaid Other | Admitting: Anesthesiology

## 2014-12-24 ENCOUNTER — Encounter (HOSPITAL_COMMUNITY): Payer: Self-pay | Admitting: Emergency Medicine

## 2014-12-24 ENCOUNTER — Inpatient Hospital Stay (HOSPITAL_COMMUNITY)
Admission: AD | Admit: 2014-12-24 | Discharge: 2014-12-26 | DRG: 775 | Disposition: A | Discharge status: Home / Self Care | Payer: Medicaid Other | Source: Ambulatory Visit | Attending: Obstetrics and Gynecology | Admitting: Obstetrics and Gynecology

## 2014-12-24 ENCOUNTER — Encounter (HOSPITAL_COMMUNITY): Payer: Self-pay | Admitting: *Deleted

## 2014-12-24 DIAGNOSIS — O411230 Chorioamnionitis, third trimester, not applicable or unspecified: Secondary | ICD-10-CM | POA: Diagnosis present

## 2014-12-24 DIAGNOSIS — O99824 Streptococcus B carrier state complicating childbirth: Secondary | ICD-10-CM | POA: Diagnosis present

## 2014-12-24 DIAGNOSIS — O471 False labor at or after 37 completed weeks of gestation: Secondary | ICD-10-CM | POA: Insufficient documentation

## 2014-12-24 DIAGNOSIS — IMO0001 Reserved for inherently not codable concepts without codable children: Secondary | ICD-10-CM

## 2014-12-24 DIAGNOSIS — Z3A38 38 weeks gestation of pregnancy: Secondary | ICD-10-CM | POA: Insufficient documentation

## 2014-12-24 HISTORY — DX: Major depressive disorder, single episode, unspecified: F32.9

## 2014-12-24 HISTORY — DX: Depression, unspecified: F32.A

## 2014-12-24 HISTORY — DX: Unspecified appendicitis: K37

## 2014-12-24 HISTORY — DX: Unspecified ectopic pregnancy without intrauterine pregnancy: O00.90

## 2014-12-24 LAB — CBC
HCT: 37.8 % (ref 36.0–46.0)
HEMATOCRIT: 37.8 % (ref 36.0–46.0)
HEMOGLOBIN: 12.9 g/dL (ref 12.0–15.0)
Hemoglobin: 12.3 g/dL (ref 12.0–15.0)
MCH: 29.2 pg (ref 26.0–34.0)
MCH: 30.4 pg (ref 26.0–34.0)
MCHC: 32.5 g/dL (ref 30.0–36.0)
MCHC: 34.1 g/dL (ref 30.0–36.0)
MCV: 88.9 fL (ref 78.0–100.0)
MCV: 89.8 fL (ref 78.0–100.0)
Platelets: 234 10*3/uL (ref 150–400)
Platelets: 250 10*3/uL (ref 150–400)
RBC: 4.21 MIL/uL (ref 3.87–5.11)
RBC: 4.25 MIL/uL (ref 3.87–5.11)
RDW: 13.4 % (ref 11.5–15.5)
RDW: 13.5 % (ref 11.5–15.5)
WBC: 10.6 10*3/uL — ABNORMAL HIGH (ref 4.0–10.5)
WBC: 16.4 10*3/uL — AB (ref 4.0–10.5)

## 2014-12-24 LAB — RAPID HIV SCREEN (WH-MAU): Rapid HIV Screen: NONREACTIVE

## 2014-12-24 LAB — OB RESULTS CONSOLE ABO/RH: RH Type: POSITIVE

## 2014-12-24 LAB — TYPE AND SCREEN
ABO/RH(D): A POS
ABO/RH(D): A POS
Antibody Screen: NEGATIVE
Antibody Screen: NEGATIVE

## 2014-12-24 LAB — OB RESULTS CONSOLE HIV ANTIBODY (ROUTINE TESTING): HIV: NONREACTIVE

## 2014-12-24 LAB — ABO/RH
ABO/RH(D): A POS
ABO/RH(D): A POS

## 2014-12-24 LAB — RPR

## 2014-12-24 MED ORDER — LIDOCAINE HCL (PF) 1 % IJ SOLN
30.0000 mL | INTRAMUSCULAR | Status: DC | PRN
  Administered 2014-12-25: 30 mL via SUBCUTANEOUS
  Filled 2014-12-24: qty 30

## 2014-12-24 MED ORDER — PENICILLIN G POTASSIUM 5000000 UNITS IJ SOLR
2.5000 10*6.[IU] | INTRAVENOUS | Status: DC
  Administered 2014-12-24 – 2014-12-25 (×2): 2.5 10*6.[IU] via INTRAVENOUS
  Filled 2014-12-24 (×5): qty 2.5

## 2014-12-24 MED ORDER — EPHEDRINE 5 MG/ML INJ
10.0000 mg | INTRAVENOUS | Status: DC | PRN
  Filled 2014-12-24: qty 2

## 2014-12-24 MED ORDER — ACETAMINOPHEN 325 MG PO TABS: 650.0000 mg | ORAL_TABLET | ORAL | Status: DC | PRN

## 2014-12-24 MED ORDER — LACTATED RINGERS IV SOLN
INTRAVENOUS | Status: DC
  Administered 2014-12-24 – 2014-12-25 (×2): via INTRAVENOUS

## 2014-12-24 MED ORDER — ONDANSETRON HCL 4 MG/2ML IJ SOLN
4.0000 mg | Freq: Four times a day (QID) | INTRAMUSCULAR | Status: DC | PRN
  Administered 2014-12-25: 4 mg via INTRAVENOUS
  Filled 2014-12-24: qty 2

## 2014-12-24 MED ORDER — OXYTOCIN BOLUS FROM INFUSION
500.0000 mL | INTRAVENOUS | Status: DC
  Administered 2014-12-25: 500 mL via INTRAVENOUS

## 2014-12-24 MED ORDER — DIPHENHYDRAMINE HCL 50 MG/ML IJ SOLN: 12.5000 mg | INTRAMUSCULAR | Status: DC | PRN

## 2014-12-24 MED ORDER — LACTATED RINGERS IV SOLN
500.0000 mL | INTRAVENOUS | Status: DC | PRN
  Administered 2014-12-24: 1000 mL via INTRAVENOUS
  Administered 2014-12-25: 500 mL via INTRAVENOUS

## 2014-12-24 MED ORDER — FENTANYL 2.5 MCG/ML BUPIVACAINE 1/10 % EPIDURAL INFUSION (WH - ANES)
14.0000 mL/h | INTRAMUSCULAR | Status: DC | PRN
  Administered 2014-12-24 – 2014-12-25 (×2): 14 mL/h via EPIDURAL
  Filled 2014-12-24 (×2): qty 125

## 2014-12-24 MED ORDER — PENICILLIN G POTASSIUM 5000000 UNITS IJ SOLR
5.0000 10*6.[IU] | Freq: Once | INTRAMUSCULAR | Status: AC
  Administered 2014-12-24: 5 10*6.[IU] via INTRAVENOUS
  Filled 2014-12-24: qty 5

## 2014-12-24 MED ORDER — PHENYLEPHRINE 40 MCG/ML (10ML) SYRINGE FOR IV PUSH (FOR BLOOD PRESSURE SUPPORT)
80.0000 ug | PREFILLED_SYRINGE | INTRAVENOUS | Status: DC | PRN
  Filled 2014-12-24: qty 2

## 2014-12-24 MED ORDER — FENTANYL 2.5 MCG/ML BUPIVACAINE 1/10 % EPIDURAL INFUSION (WH - ANES)
INTRAMUSCULAR | Status: DC | PRN
  Administered 2014-12-24: 14 mL/h via EPIDURAL

## 2014-12-24 MED ORDER — PHENYLEPHRINE 40 MCG/ML (10ML) SYRINGE FOR IV PUSH (FOR BLOOD PRESSURE SUPPORT)
80.0000 ug | PREFILLED_SYRINGE | INTRAVENOUS | Status: DC | PRN
  Filled 2014-12-24: qty 2
  Filled 2014-12-24: qty 10

## 2014-12-24 MED ORDER — OXYCODONE-ACETAMINOPHEN 5-325 MG PO TABS: 1.0000 | ORAL_TABLET | ORAL | Status: DC | PRN

## 2014-12-24 MED ORDER — LACTATED RINGERS IV SOLN
500.0000 mL | Freq: Once | INTRAVENOUS | Status: AC
  Administered 2014-12-24: 500 mL via INTRAVENOUS

## 2014-12-24 MED ORDER — OXYCODONE-ACETAMINOPHEN 5-325 MG PO TABS
2.0000 | ORAL_TABLET | Freq: Once | ORAL | Status: AC
  Administered 2014-12-24: 2 via ORAL
  Filled 2014-12-24: qty 2

## 2014-12-24 MED ORDER — OXYTOCIN 40 UNITS IN LACTATED RINGERS INFUSION - SIMPLE MED
62.5000 mL/h | INTRAVENOUS | Status: DC
  Filled 2014-12-24: qty 1000

## 2014-12-24 MED ORDER — CITRIC ACID-SODIUM CITRATE 334-500 MG/5ML PO SOLN: 30.0000 mL | ORAL | Status: DC | PRN

## 2014-12-24 MED ORDER — LIDOCAINE HCL (PF) 1 % IJ SOLN
INTRAMUSCULAR | Status: DC | PRN
  Administered 2014-12-24: 4 mL
  Administered 2014-12-24: 7 mL
  Administered 2014-12-24: 4 mL

## 2014-12-24 MED ORDER — PIPERACILLIN-TAZOBACTAM 3.375 G IVPB
3.3750 g | Freq: Once | INTRAVENOUS | Status: DC
Start: 2014-12-24 — End: 2014-12-24

## 2014-12-24 MED ORDER — OXYCODONE-ACETAMINOPHEN 5-325 MG PO TABS: 2.0000 | ORAL_TABLET | ORAL | Status: DC | PRN

## 2014-12-24 NOTE — Anesthesia Preprocedure Evaluation (Signed)
Anesthesia Evaluation  Patient identified by MRN, date of birth, ID band Patient awake    Reviewed: Allergy & Precautions, H&P , NPO status   Airway Mallampati: III  TM Distance: >3 FB Neck ROM: Full    Dental no notable dental hx. (+) Teeth Intact   Pulmonary neg pulmonary ROS,  breath sounds clear to auscultation- rhonchi  Pulmonary exam normal       Cardiovascular negative cardio ROS  Rhythm:Regular Rate:Normal     Neuro/Psych Depression negative neurological ROS  negative psych ROS   GI/Hepatic Neg liver ROS, GERD-  Medicated and Controlled,  Endo/Other  negative endocrine ROS  Renal/GU negative Renal ROS  negative genitourinary   Musculoskeletal Hx/o Herniated disc   Abdominal (+) - obese,   Peds  Hematology   Anesthesia Other Findings   Reproductive/Obstetrics (+) Pregnancy                             Anesthesia Physical Anesthesia Plan  ASA: II  Anesthesia Plan: Epidural   Post-op Pain Management:    Induction:   Airway Management Planned: Natural Airway  Additional Equipment:   Intra-op Plan:   Post-operative Plan:   Informed Consent: I have reviewed the patients History and Physical, chart, labs and discussed the procedure including the risks, benefits and alternatives for the proposed anesthesia with the patient or authorized representative who has indicated his/her understanding and acceptance.     Plan Discussed with: Anesthesiologist  Anesthesia Plan Comments:         Anesthesia Quick Evaluation

## 2014-12-24 NOTE — Anesthesia Procedure Notes (Signed)
Epidural Patient location during procedure: OB Start time: 12/24/2014 7:46 PM  Staffing Anesthesiologist: Tamesha Ellerbrock A. Performed by: anesthesiologist   Preanesthetic Checklist Completed: patient identified, site marked, surgical consent, pre-op evaluation, timeout performed, IV checked, risks and benefits discussed and monitors and equipment checked  Epidural Patient position: sitting Prep: site prepped and draped and DuraPrep Patient monitoring: continuous pulse ox and blood pressure Approach: midline Location: L4-L5 Injection technique: LOR air  Needle:  Needle type: Tuohy  Needle gauge: 17 G Needle length: 9 cm and 9 Needle insertion depth: 5 cm cm Catheter type: closed end flexible Catheter size: 19 Gauge Catheter at skin depth: 10 cm Test dose: negative and Other  Assessment Events: blood not aspirated, injection not painful, no injection resistance, negative IV test and no paresthesia  Additional Notes Patient identified. Risks and benefits discussed including failed block, incomplete  Pain control, post dural puncture headache, nerve damage, paralysis, blood pressure Changes, nausea, vomiting, reactions to medications-both toxic and allergic and post Partum back pain. All questions were answered. Patient expressed understanding and wished to proceed. Sterile technique was used throughout procedure. Epidural site was Dressed with sterile barrier dressing. No paresthesias, signs of intravascular injection Or signs of intrathecal spread were encountered.  Patient was more comfortable after the epidural was dosed. Please see RN's note for documentation of vital signs and FHR which are stable.

## 2014-12-24 NOTE — Progress Notes (Signed)
Pt self positioning for comfort

## 2014-12-24 NOTE — Discharge Instructions (Signed)
[facility] if [facility] develop further contractions, leaking of fluid, or other new and concerning symptoms.   Tercer trimestre de Psychiatristembarazo (Third Trimester of Pregnancy) El tercer trimestre va desde la semana29 hasta la 42, desde el sptimo hasta el noveno mes, y es la poca en la que el feto crece ms rpidamente. Hacia el final del noveno mes, el feto mide alrededor de 20pulgadas (45cm) de largo y pesa entre 6 y 10 libras (2,700 y 374,500kg).  CAMBIOS EN EL ORGANISMO Su organismo atraviesa por muchos cambios durante el Matherembarazo, y estos varan de Neomia Dearuna mujer a Educational psychologistotra.   Seguir American Standard Companiesaumentando de peso. Es de esperar que aumente entre 25 y 35libras (11 y 16kg) hacia el final del Psychiatristembarazo.  Podrn aparecer las primeras Albertson'sestras en las caderas, el abdomen y las Carpendalemamas.  Puede tener necesidad de Geographical information systems officerorinar con ms frecuencia porque el feto baja hacia la pelvis y ejerce presin sobre la vejiga.  Debido al Vanetta Muldersembarazo podr sentir Anthoney Haradaacidez estomacal con frecuencia.  Puede estar estreida, ya que ciertas hormonas enlentecen los movimientos de los msculos que New York Life Insuranceempujan los desechos a travs de los intestinos.  Pueden aparecer hemorroides o abultarse e hincharse las venas (venas varicosas).  Puede sentir dolor plvico debido al Con-wayaumento de peso y a que las hormonas del Management consultantembarazo relajan las articulaciones entre los huesos de la pelvis. El dolor de espalda puede ser consecuencia de la sobrecarga de los msculos que soportan la Money Islandpostura.  Tal vez haya cambios en el cabello que pueden incluir su engrosamiento, crecimiento rpido y cambios en la textura. Adems, a algunas mujeres se les cae el cabello durante o despus del embarazo, o tienen el cabello seco o fino. Lo ms probable es que el cabello se le normalice despus del nacimiento del beb.  Las ConAgra Foodsmamas seguirn creciendo y Development worker, communityle dolern. A veces, puede haber una secrecin amarilla de las mamas llamada calostro.  El ombligo puede salir hacia  afuera.  Puede sentir que le falta el aire debido a que se expande el tero.  Puede notar que el feto "baja" o lo siente ms bajo, en el abdomen.  Puede tener una prdida de secrecin mucosa con sangre. Esto suele ocurrir en el trmino de unos [address]pocos das a una semana antes de que comience el Grand Salinetrabajo de Monroeparto.  El cuello del tero se vuelve delgado y blando (se borra) cerca de la fecha de Kelliherparto. QU DEBE ESPERAR EN LOS EXMENES PRENATALES  Le harn exmenes prenatales cada 2semanas hasta la semana36. A partir de ese momento le harn exmenes semanales. Durante una visita prenatal de rutina:  La pesarn para asegurarse de que usted y el feto estn creciendo normalmente.  Le tomarn la presin arterial.  Le medirn el abdomen para controlar el desarrollo del beb.  Se escucharn los latidos cardacos fetales.  Se evaluarn los resultados de los estudios solicitados en visitas anteriores.  Le revisarn el cuello del tero cuando est prxima la fecha de parto para controlar si este se ha borrado. Alrededor de la semana36, el mdico le revisar el cuello del tero. Al mismo tiempo, realizar un anlisis de las secreciones del tejido vaginal. Este examen es para determinar si hay un tipo de bacteria, estreptococo Grupo B. El mdico le explicar esto con ms detalle. El mdico puede preguntarle lo siguiente:  Cmo le gustara que fuera el Fair[place] Beachparto.  Cmo se siente.  Si siente los movimientos del beb.  Si ha tenido sntomas anormales, como prdida de lquido,  sangrado, dolores de cabeza intensos o clicos abdominales.  Si tiene Colgate-Palmolivealguna pregunta. Otros exmenes o estudios de deteccin que pueden realizarse durante el tercer trimestre incluyen lo siguiente:  Anlisis de sangre para controlar las concentraciones de hierro (anemia).  Controles fetales para determinar su salud, nivel de Saint Vincent and the Grenadinesactividad y Designer, jewellerycrecimiento. Si tiene Jerseyalguna enfermedad o hay problemas durante el embarazo, le harn  estudios. FALSO TRABAJO DE PARTO Es posible que sienta contracciones leves e irregulares que finalmente desaparecen. Se llaman contracciones de [address] StreetBraxton Hicks o falso trabajo de Nenanaparto. Las Fifth Third Bancorpcontracciones pueden durar horas, [address]das o incluso semanas, antes de que el verdadero trabajo de parto se inicie. Si las contracciones ocurren a intervalos regulares, se intensifican o se hacen dolorosas, lo mejor es que la revise el mdico.  SIGNOS DE TRABAJO DE PARTO   Clicos de tipo menstrual.  Contracciones cada 5minutos o menos.  Contracciones que comienzan en la parte superior del tero y se extienden hacia abajo, a la zona inferior del abdomen y la espalda.  Sensacin de mayor presin en la pelvis o dolor de espalda.  Una secrecin de mucosidad acuosa o con sangre que sale de la vagina. Si tiene alguno de estos signos antes de la semana37 del Psychiatristembarazo, llame a su mdico de inmediato. Debe concurrir al hospital para que la controlen inmediatamente. INSTRUCCIONES PARA EL CUIDADO EN EL HOGAR   Evite fumar, consumir hierbas, beber alcohol y tomar frmacos que no le hayan recetado. Estas sustancias qumicas afectan la formacin y el desarrollo del beb.  Siga las indicaciones del mdico en relacin con el uso de medicamentos. Durante el embarazo, hay medicamentos que son seguros de tomar y otros que no.  Haga actividad fsica solo en la forma indicada por el mdico. Sentir clicos uterinos es un buen signo para Restaurant manager, fast fooddetener la actividad fsica.  Contine comiendo alimentos que sanos con regularidad.  Use un sostn que le brinde buen soporte si le Altria Groupduelen las mamas.  No se d baos de inmersin en agua caliente, baos turcos ni saunas.  Colquese el cinturn de seguridad cuando conduzca.  No coma carne cruda ni queso sin cocinar; evite el contacto con las bandejas sanitarias de los gatos y la tierra que estos animales usan. Estos elementos contienen grmenes que pueden causar defectos congnitos en el  beb.  Tome las vitaminas prenatales.  Si est estreida, pruebe un laxante suave (si el mdico lo autoriza). Consuma ms alimentos ricos en fibra, como vegetales y frutas frescos y Radiation protection practitionercereales integrales. Beba gran cantidad de lquido para mantener la orina de tono claro o color amarillo plido.  Dese baos de asiento con agua tibia para Engineer, materialsaliviar el dolor o las molestias causadas por las hemorroides. Use una crema para las hemorroides si el mdico la autoriza.  Si tiene venas varicosas, use medias de descanso. Eleve los pies durante 15minutos, 3 o 4veces por da. Limite la cantidad de sal en su dieta.  Evite levantar objetos pesados, use zapatos de tacones bajos y Brazilmantenga una buena postura.  Descanse con las piernas elevadas si tiene calambres o dolor de cintura.  Visite a su dentista si no lo ha Occupational hygienisthecho durante el embarazo. Use un cepillo de dientes blando para higienizarse los dientes y psese el hilo dental con suavidad.  Puede seguir Calpine Corporationmanteniendo relaciones sexuales, a menos que el mdico le indique lo contrario.  No haga viajes largos excepto que sea absolutamente necesario y solo con la autorizacin del mdico.  Tome clases prenatales para entender, Education administratorpracticar y hacer preguntas  sobre el [place] de parto y [place].  Haga un ensayo de la partida al hospital.  Prepare el bolso que llevar al hospital.  Prepare la habitacin del beb.  Concurra a todas las visitas prenatales segn las indicaciones de su mdico. SOLICITE ATENCIN MDICA SI:  No est segura de que est en trabajo de parto o de que ha roto la bolsa de las aguas.  Tiene mareos.  Siente clicos leves, presin en la pelvis o dolor persistente en el abdomen.  Tiene nuseas, vmitos o diarrea persistentes.  Tiene secrecin vaginal con mal olor.  Siente dolor al ConocoPhillips. SOLICITE ATENCIN MDICA DE INMEDIATO SI:   Tiene fiebre.  Tiene una prdida de lquido por la vagina.  Tiene sangrado o pequeas prdidas  vaginales.  Siente dolor intenso o clicos en el abdomen.  Sube o baja de peso rpidamente.  Tiene dificultad para respirar y siente dolor de pecho.  Sbitamente se le hinchan mucho el rostro, las [place], los tobillos, los pies o las piernas.  No ha sentido los movimientos del beb durante Georgianne Fick.  Siente un dolor de cabeza intenso que no se alivia con medicamentos.  Hay cambios en la visin. Document Released: 09/20/2005 Document Revised: 12/16/2013 [facility] Patient Information [address] [place], [place]. This information is not intended to replace advice given to you by your health care provider. Make sure you discuss any questions you have with your health care provider.

## 2014-12-24 NOTE — Progress Notes (Signed)
Advised Dr Dolan AmenHarroway-Smith that [facility]. Provided SVE, FHT, UC status. OK to d/c home per Dr Dolan AmenHarroway-Smith. Pt and husband [facility] with any pregnancy issues, pain, leaking fluid and vaginal bleeding.

## 2014-12-24 NOTE — ED Notes (Signed)
OB Rapid response RN/Kathy paged

## 2014-12-24 NOTE — ED Notes (Signed)
Patient speaks very little english. Spanish is 1st language.

## 2014-12-24 NOTE — Progress Notes (Signed)
[facility] started [facility] this evening. She is a G2P1, EDD 01/02/2015 ([id number]), SVE 1/70/-2. FHT 110-120/multiple accelerations/no decelerations. Patient does not speak english, she is aware of her contractions but doesn't appear in distress.

## 2014-12-24 NOTE — H&P (Signed)
LABOR ADMISSION HISTORY AND PHYSICAL  Ebony Ryan is a 23 y.o. female G2P0010 with IUP at 6547w5d by LMP presenting for in active labor. She reports +FMs, No LOF, no VB, no blurry vision, headaches or peripheral edema, and RUQ pain. She desires an epidural for labor pain control. She plans on breast and bottle feeding. She is unsure about about birth control. Epidural upon request  Dating: By LMP c/w 5287w3d sono --->  Estimated Date of Delivery: 01/02/15   Prenatal History/Complications:  Past Medical History: Past Medical History  Diagnosis Date  . Ectopic pregnancy     02/2013  . Appendicitis     2014  . Depression     Past Surgical History: Past Surgical History  Procedure Laterality Date  . Laparoscopic appendectomy N/A 07/03/2013    Procedure: APPENDECTOMY LAPAROSCOPIC;  Surgeon: Emelia LoronMatthew Wakefield, MD;  [facility];  Service: General;  Laterality: N/A;    Obstetrical History: OB History    Gravida Para Term Preterm AB TAB SAB Ectopic Multiple Living   2    1   1         Social History: History   Social History  . Marital Status: Married    Spouse Name: N/A    Number of Children: N/A  . Years of Education: N/A   Social History Main Topics  . Smoking status: Never Smoker   . Smokeless tobacco: Never Used  . Alcohol Use: No  . Drug Use: No  . Sexual Activity: Yes   Other Topics Concern  . None   Social History Narrative    Family History: History reviewed. No pertinent family history.  Allergies: No Known Allergies  Prescriptions prior to admission  Medication Sig Dispense Refill Last Dose  . Prenatal Vit-Fe Fumarate-FA (PRENATAL VITAMIN PO) Take 1 tablet by mouth daily.   Past Week at Unknown time  . famotidine (PEPCID) 20 MG tablet Take 1 tablet (20 mg total) by mouth 2 (two) times daily. (Patient not taking: Reported on 12/24/2014) 60 tablet 1 More than a month at Unknown time  . ondansetron (ZOFRAN) 4 MG tablet Take 1 tablet (4 mg  total) by mouth every 8 (eight) hours as needed for nausea or vomiting. (Patient not taking: Reported on 12/24/2014) 10 tablet 3 More than a month at Unknown time  . promethazine (PHENERGAN) 25 MG suppository Place 1 suppository (25 mg total) rectally every 6 (six) hours as needed for nausea or vomiting. (Patient not taking: Reported on 12/24/2014) 12 each 1 More than a month at Unknown time  . promethazine (PHENERGAN) 25 MG tablet Take 1 tablet (25 mg total) by mouth every 6 (six) hours as needed for nausea or vomiting. (Patient not taking: Reported on 12/24/2014) 30 tablet 1 More than a month at Unknown time     Review of Systems   All systems reviewed and negative except as stated in HPI  Blood pressure 114/57, pulse 97, temperature 98.3 F (36.8 C), temperature source Oral, resp. rate 20, height 5\' 3"  (1.6 m), weight 159 lb (72.122 kg), last menstrual period 03/28/2014, SpO2 97 %. General appearance: alert, cooperative and moderate distress with contractions Lungs: clear to auscultation bilaterally Heart: regular rate and rhythm Abdomen: soft, non-tender; bowel sounds normal Extremities: Homans sign is negative, no sign of DVT Presentation: cephalic Fetal monitoringBaseline: 120 bpm, Variability: Good {> 6 bpm), Accelerations: Reactive and Decelerations: Absent Uterine activityq384min  Dilation: 4 Effacement (%): 90 Station: -1 Exam by:: Alisia FerrariBelinda Bethea Rn  Prenatal labs: ABO, Rh: --/--/A POS, A POS (12/31 0014) Antibody: NEG (12/31 0014) Rubella:   RPR: NON REAC (12/31 0035)  HBsAg: Negative (06/11 0000)  HIV: Non-reactive (12/31 0000)  GBS: Positive (12/14 0000)  1 hr Glucola 122 Genetic screening  Neg quad Anatomy US normal    Results for orders placed or performed during the hospital encounter of 12/24/14 (from the past 24 hour(s))  OB RESULTS CONSOLE HIV antibody   Collection Time: 12/24/14 12:00 AM  Result Value Ref Range   HIV Non-reactive   OB RESULTS CONSOLE  ABO/Rh   Collection Time: 12/24/14 12:00 AM  Result Value Ref Range   RH Type  Positive    ABO Grouping A   CBC   Collection Time: 12/24/14  6:15 PM  Result Value Ref Range   WBC 16.4 (H) 4.0 - 10.5 K/uL   RBC 4.25 3.87 - 5.11 MIL/uL   Hemoglobin 12.9 12.0 - 15.0 g/dL   HCT 44.037.8 10.236.0 - 72.546.0 %   MCV 88.9 78.0 - 100.0 fL   MCH 30.4 26.0 - 34.0 pg   MCHC 34.1 30.0 - 36.0 g/dL   RDW 36.613.5 44.011.5 - 34.715.5 %   Platelets 234 150 - 400 K/uL  Results for orders placed or performed during the hospital encounter of 12/23/14 (from the past 24 hour(s))  ABO/Rh (If pregnant)   Collection Time: 12/24/14 12:14 AM  Result Value Ref Range   ABO/RH(D) A POS   Type and screen   Collection Time: 12/24/14 12:14 AM  Result Value Ref Range   ABO/RH(D) A POS    Antibody Screen NEG    Sample Expiration 12/27/2014   CBC (add if pt pregnant)   Collection Time: 12/24/14 12:20 AM  Result Value Ref Range   WBC 10.6 (H) 4.0 - 10.5 K/uL   RBC 4.21 3.87 - 5.11 MIL/uL   Hemoglobin 12.3 12.0 - 15.0 g/dL   HCT 42.537.8 95.636.0 - 38.746.0 %   MCV 89.8 78.0 - 100.0 fL   MCH 29.2 26.0 - 34.0 pg   MCHC 32.5 30.0 - 36.0 g/dL   RDW 56.413.4 33.211.5 - 95.115.5 %   Platelets 250 150 - 400 K/uL  Rapid HIV screen [facility])   Collection Time: 12/24/14 12:35 AM  Result Value Ref Range   SUDS Rapid HIV Screen NON REACTIVE NON REACTIVE  RPR   Collection Time: 12/24/14 12:35 AM  Result Value Ref Range   RPR NON REAC NON REAC    Patient Active Problem List   Diagnosis Date Noted  . Active labor 12/24/2014  . Acute appendicitis 07/03/2013    Assessment: Ebony Ryan is a 23 y.o. G2P0010 at 8219w5d here for   #Labor: expectant mgmt #Pain: Epidural upon request #FWB: Cat I #ID:  GBS + => PCN #MOF: breast #MOC:undecided   Ebony Ryan 12/24/2014, 8:04 PM

## 2014-12-24 NOTE — ED Notes (Signed)
Via interpreter: Patient began to have abdominal cramps this morning. Pain persisted throughout the day and has become more regular and stronger. Denies passing any blood or fluid via vaginal canal. Has been receiving prenatal care at [facility].

## 2014-12-24 NOTE — Progress Notes (Signed)
Translator, Elna Breslowarol Hernandez, at bedside to provide translation services during epidural placement and shift assessment.

## 2014-12-24 NOTE — MAU Note (Signed)
Urine in lab 

## 2014-12-24 NOTE — Progress Notes (Signed)
I assisted Olegario MessierKathy, RN with discharge instructions.  Eda H Royal  Interpreter.

## 2014-12-24 NOTE — MAU Note (Signed)
Pt was seen here earlier today for contractions, Pt  Was observed for then rechecked  And sent home

## 2014-12-24 NOTE — Progress Notes (Signed)
Translator, Ebony Ryan, at bedside for translation during cervical exam and foley catheter insertion.

## 2014-12-24 NOTE — ED Notes (Signed)
Patient here from home with abdominal pain that began today. Currently 38-[redacted] weeks pregnant with EDD @ 01/02/14. Denies bleeding and rupture of membranes.

## 2014-12-24 NOTE — ED Provider Notes (Signed)
CSN: [id number]     Arrival date & time 12/23/14  2358 History   First MD Initiated Contact with Patient 12/24/14 0020     This chart was scribed for Geoffery Lyonsouglas Lenola Lockner, MD by Arlan OrganAshley Leger, ED Scribe. [facility] and the patient's care was started 12:22 AM.   Chief Complaint  Patient presents with  . Abdominal Pain   Patient is a 23 y.o. female presenting with abdominal pain. The history is provided by the patient.  Abdominal Pain Pain location:  Generalized Pain radiates to:  Does not radiate Pain severity:  Moderate Timing:  Constant Progression:  Unchanged Chronicity:  New Relieved by:  None tried Worsened by:  Nothing tried Ineffective treatments:  None tried Associated symptoms: no chills and no fever   Risk factors: pregnancy      HPI Comments: Ebony Ryan currently G1P0 at 7338 weeks gestation is a 23 y.o. female who presents to the Emergency Department complaining of constant, moderate abdominal pain onset this evening. No noticeable fluid of bleeding noted. She admits to 1 miscarriage approximately 2 years ago. Pt has been followed by the Health Department. No known allergies to medications.  History reviewed. No pertinent past medical history. Past Surgical History  Procedure Laterality Date  . Laparoscopic appendectomy N/A 07/03/2013    Procedure: APPENDECTOMY LAPAROSCOPIC;  Surgeon: Emelia LoronMatthew Wakefield, MD;  [facility];  Service: General;  Laterality: N/A;   History reviewed. No pertinent family history. History  Substance Use Topics  . Smoking status: Never Smoker   . Smokeless tobacco: Never Used  . Alcohol Use: No   OB History    Gravida Para Term Preterm AB TAB SAB Ectopic Multiple Living   2    1   1        Review of Systems  Constitutional: Negative for fever and chills.  Gastrointestinal: Positive for abdominal pain.  All other systems reviewed and are negative.     Allergies  Review of patient's allergies  indicates no known allergies.  Home Medications   Prior to Admission medications   Medication Sig Start Date End Date Taking? Authorizing Provider  anti-nausea (EMETROL) solution Take 15 mLs by mouth 2 (two) times daily.    Historical Provider, MD  famotidine (PEPCID) 20 MG tablet Take 1 tablet (20 mg total) by mouth 2 (two) times daily. 06/02/14 06/02/15  Dorathy KinsmanVirginia Smith, CNM  Multiple Vitamins-Minerals (MULTIVITAMIN PO) Take 1 tablet by mouth daily.    Historical Provider, MD  ondansetron (ZOFRAN) 4 MG tablet Take 1 tablet (4 mg total) by mouth every 8 (eight) hours as needed for nausea or vomiting. 06/02/14 06/02/15  Dorathy KinsmanVirginia Smith, CNM  promethazine (PHENERGAN) 25 MG suppository Place 1 suppository (25 mg total) rectally every 6 (six) hours as needed for nausea or vomiting. 06/01/14   Dorathy KinsmanVirginia Smith, CNM  promethazine (PHENERGAN) 25 MG tablet Take 1 tablet (25 mg total) by mouth every 6 (six) hours as needed for nausea or vomiting. 06/01/14   Dorathy KinsmanVirginia Smith, CNM   Triage Vitals: BP 124/80 mmHg  Pulse 91  Temp(Src) 98.3 F (36.8 C) (Oral)  Resp 18  SpO2 100%  LMP 03/28/2014   Physical Exam  Constitutional: She is oriented to person, place, and time. She appears well-developed and well-nourished. No distress.  HENT:  Head: Normocephalic and atraumatic.  Eyes: EOM are normal.  Neck: Normal range of motion.  Cardiovascular: Normal rate, regular rhythm and normal heart sounds.   Pulmonary/Chest: Effort normal  and breath sounds normal.  Abdominal: Soft. She exhibits no distension. There is no tenderness.  Fundal height is consistent with gestational age.  Genitourinary:  Per rapid response nurse pt is 1 cm dilated 70% effaced and minus 2 station   Musculoskeletal: Normal range of motion.  Neurological: She is alert and oriented to person, place, and time.  Skin: Skin is warm and dry.  Psychiatric: She has a normal mood and affect. Judgment normal.  Nursing note and vitals reviewed.   ED  Course  Procedures (including critical care time)  DIAGNOSTIC STUDIES: Oxygen Saturation is 100% on RA, Normal by my interpretation.    COORDINATION OF CARE: 12:21 AM-Discussed treatment plan with pt at bedside and pt agreed to plan.     Labs Review Labs Reviewed  CBC  ABO/RH  TYPE AND SCREEN    Imaging Review No results found.   EKG Interpretation None      MDM   Final diagnoses:  None    Patient presents at [redacted] weeks gestation with complaints of contractions.  She speaks little [place], history taken with the help of husband at bedside who is acting as Nurse, learning disabilitytranslator.  Fetal monitoring reassuring, no delivery imminent.  Patient seen by rapid response OB.  Will be discharged.  I personally performed the services described in this documentation, which was scribed in my presence. The recorded information has been reviewed and is accurate.    Geoffery Lyonsouglas Glenna Brunkow, MD 12/27/14 [phone]

## 2014-12-24 NOTE — Progress Notes (Signed)
I assisted Kim from admission with questions. I assisted Scientist, forensicLynette RN from [employer] with questions and instructions, and explanation of plan of care. By Orlan LeavensViria Alvarez Interpreter

## 2014-12-25 ENCOUNTER — Encounter (HOSPITAL_COMMUNITY): Payer: Self-pay | Admitting: *Deleted

## 2014-12-25 MED ORDER — WITCH HAZEL-GLYCERIN EX PADS: 1.0000 "application " | MEDICATED_PAD | CUTANEOUS | Status: DC | PRN

## 2014-12-25 MED ORDER — PRENATAL MULTIVITAMIN CH
1.0000 | ORAL_TABLET | Freq: Every day | ORAL | Status: DC
  Administered 2014-12-26: 1 via ORAL
  Filled 2014-12-25: qty 1

## 2014-12-25 MED ORDER — OXYCODONE-ACETAMINOPHEN 5-325 MG PO TABS: 2.0000 | ORAL_TABLET | ORAL | Status: DC | PRN

## 2014-12-25 MED ORDER — ONDANSETRON HCL 4 MG/2ML IJ SOLN: 4.0000 mg | INTRAMUSCULAR | Status: DC | PRN

## 2014-12-25 MED ORDER — SIMETHICONE 80 MG PO CHEW: 80.0000 mg | CHEWABLE_TABLET | ORAL | Status: DC | PRN

## 2014-12-25 MED ORDER — SENNOSIDES-DOCUSATE SODIUM 8.6-50 MG PO TABS
2.0000 | ORAL_TABLET | ORAL | Status: DC
  Administered 2014-12-25: 2 via ORAL
  Filled 2014-12-25: qty 2

## 2014-12-25 MED ORDER — OXYCODONE-ACETAMINOPHEN 5-325 MG PO TABS: 1.0000 | ORAL_TABLET | ORAL | Status: DC | PRN

## 2014-12-25 MED ORDER — SODIUM CHLORIDE 0.9 % IV SOLN
2.0000 g | Freq: Four times a day (QID) | INTRAVENOUS | Status: DC
  Administered 2014-12-25: 2 g via INTRAVENOUS
  Filled 2014-12-25 (×3): qty 2000

## 2014-12-25 MED ORDER — GENTAMICIN SULFATE 40 MG/ML IJ SOLN
160.0000 mg | Freq: Three times a day (TID) | INTRAVENOUS | Status: DC
  Administered 2014-12-25: 160 mg via INTRAVENOUS
  Filled 2014-12-25 (×2): qty 4

## 2014-12-25 MED ORDER — ACETAMINOPHEN 650 MG RE SUPP
650.0000 mg | RECTAL | Status: DC | PRN
  Administered 2014-12-25: 650 mg via RECTAL
  Filled 2014-12-25: qty 1

## 2014-12-25 MED ORDER — IBUPROFEN 600 MG PO TABS
600.0000 mg | ORAL_TABLET | Freq: Four times a day (QID) | ORAL | Status: DC
  Administered 2014-12-25 – 2014-12-26 (×5): 600 mg via ORAL
  Filled 2014-12-25 (×5): qty 1

## 2014-12-25 MED ORDER — ZOLPIDEM TARTRATE 5 MG PO TABS: 5.0000 mg | ORAL_TABLET | Freq: Every evening | ORAL | Status: DC | PRN

## 2014-12-25 MED ORDER — LANOLIN HYDROUS EX OINT: TOPICAL_OINTMENT | CUTANEOUS | Status: DC | PRN

## 2014-12-25 MED ORDER — ONDANSETRON HCL 4 MG PO TABS: 4.0000 mg | ORAL_TABLET | ORAL | Status: DC | PRN

## 2014-12-25 MED ORDER — TETANUS-DIPHTH-ACELL PERTUSSIS 5-2.5-18.5 LF-MCG/0.5 IM SUSP: 0.5000 mL | Freq: Once | INTRAMUSCULAR | Status: DC

## 2014-12-25 MED ORDER — BENZOCAINE-MENTHOL 20-0.5 % EX AERO: 1.0000 "application " | INHALATION_SPRAY | CUTANEOUS | Status: DC | PRN

## 2014-12-25 MED ORDER — DIBUCAINE 1 % RE OINT: 1.0000 "application " | TOPICAL_OINTMENT | RECTAL | Status: DC | PRN

## 2014-12-25 MED ORDER — DIPHENHYDRAMINE HCL 25 MG PO CAPS: 25.0000 mg | ORAL_CAPSULE | Freq: Four times a day (QID) | ORAL | Status: DC | PRN

## 2014-12-25 NOTE — Lactation Note (Signed)
This note was copied from the chart of Ebony Ebony Ryan. Lactation Consultation Note  Patient Name: Ebony Ryan [id number] Date: 12/25/2014 Reason for consult: Initial assessment of this mom and baby at 14 hours pp.  Mom is a primipara and attended [facility] breastfeeding preparation classes but is concerned that she "has no milk" although baby is latching well.  FOB speaks and understands English and translates for mom.  LC reviewed importance of avoiding supplement and cue feeding for at least first 2 weeks, encouraged frequent STS and cue feedings.  Mom says she was shown how to hand express her milk.  LC discussed LEAD cautions and supply and demand for milk production.  LATCH score at earlier feeding=8 per RN assessment.  Baby did receive a 7 ml formula feeding since then and is sound asleep on her back in open crib. Baby has already had first void and first stool. Mom encouraged to feed baby 8-12 times/24 hours and with feeding cues. LC encouraged review of Baby and Me (Spanish)  pp 9, 14 and 20-25 for STS and BF information.LC provided [employer] brochure in Spanish, and reviewed WH services and list of community and web site resources, especially [employer] website which has information available in [place]..      Maternal Data Formula Feeding for Exclusion: No Has patient been taught Hand Expression?: Yes (mom says she was shown hand expression at [facility] BF class) Does the patient have breastfeeding experience prior to this delivery?: No  Feeding    LATCH Score/Interventions           initial LATCH score=8 per RN assessment           Lactation Tools Discussed/Used WIC Program: Yes STS, cue feedings, hand expression, supply and demand, LEAD cautions regarding supplement  Consult Status Consult Status: Follow-up Date: 12/26/14 Follow-up type: In-patient    Warrick Parisian [facility] 12/25/2014, 9:41 PM

## 2014-12-25 NOTE — Anesthesia Postprocedure Evaluation (Signed)
Anesthesia Post Note  Patient: Ebony Ryan  Procedure(s) Performed: * No procedures listed *  Anesthesia type: Epidural  Patient location: Mother/Baby  Post pain: Pain level controlled  Post assessment: Post-op Vital signs reviewed  Last Vitals:  Filed Vitals:   12/25/14 1430  BP: 100/53  Pulse: 97  Temp: 36.7 C  Resp: 18    Post vital signs: Reviewed  Level of consciousness: awake  Complications: No apparent anesthesia complications

## 2014-12-25 NOTE — L&D Delivery Note (Signed)
Delivery Note At 6:45 AM a viable female was delivered via Vaginal, Spontaneous Delivery (Presentation: ; Occiput Anterior).  APGAR: 8, 9 .   Placenta status: Intact, Spontaneous.  Cord: 3 vessels with the following complications: None  Anesthesia: Epidural  Episiotomy: None Lacerations: 2nd degree Suture Repair: 3.0 vicryl Est. Blood Loss (mL): 300  Mom to postpartum.  Baby to Couplet care / Skin to Skin.  Julieann Drummonds 12/25/2014, 7:25 AM

## 2014-12-25 NOTE — Progress Notes (Signed)
I assisted Selena Batten, RN with questions and explanation of care plan. Eda H Royal  Interpreter.

## 2014-12-25 NOTE — Progress Notes (Signed)
I assisted Morrie Sheldon, RN with questions and ordered patient's Breakfast.  Eda H Royal  Interpreter.

## 2014-12-25 NOTE — Progress Notes (Signed)
Patient was referred for history of depression/anxiety. * Referral screened out by Clinical Social Worker because none of the following criteria appear to apply: ~ History of anxiety/depression during this pregnancy, or of post-partum depression. ~ Diagnosis of anxiety and/or depression within last 3 years ~ History of depression due to pregnancy loss/loss of child OR * Patient's symptoms currently being treated with medication and/or therapy. Please contact the Clinical Social Worker if needs arise, or if patient requests.  PNR states situational depression due to nausea and vomiting during pregnancy.  MOB was referred to L[facility]W at [facility]. 

## 2014-12-25 NOTE — Progress Notes (Signed)
I assisted Biochemist, clinical with some questions, by Orlan Leavens- Interpreter.

## 2014-12-25 NOTE — Progress Notes (Signed)
ANTIBIOTIC CONSULT NOTE - INITIAL  Pharmacy Consult for Gentamicin Indication: Chorioamnionitis  No Known Allergies  Patient Measurements: Height:  (160 cm) Weight: 159 lb (72.122 kg) IBW/kg (Calculated) : 52.4 Adjusted Body Weight: 58.3 kg  Vital Signs: Temp: 99.8 F (37.7 C) (01/01 0355) Temp Source: Axillary (01/01 0355) BP: 98/45 mmHg (01/01 0432) Pulse Rate: 114 (01/01 0432) Intake/Output from previous day: 12/31 0701 - 01/01 0700 In: -  Out: 675 [Urine:675] Intake/Output from this shift: Total I/O In: -  Out: 675 [Urine:675]  Labs:  Recent Labs  12/24/14 0020 12/24/14 1815  WBC 10.6* 16.4*  HGB 12.3 12.9  PLT 250 234   Estimated Creatinine Clearance: 102.3 mL/min (by C-G formula based on Cr of 0.66). No results for input(s): VANCOTROUGH, VANCOPEAK, VANCORANDOM, GENTTROUGH, GENTPEAK, GENTRANDOM, TOBRATROUGH, TOBRAPEAK, TOBRARND, AMIKACINPEAK, AMIKACINTROU, AMIKACIN in the last 72 hours.   Microbiology: Recent Results (from the past 720 hour(s))  OB RESULT CONSOLE Group B Strep     Status: None   Collection Time: 12/07/14 12:00 AM  Result Value Ref Range Status   GBS Positive  Final    Medical History: Past Medical History  Diagnosis Date  . Ectopic pregnancy     02/2013  . Appendicitis     2014  . Depression     Medications:  Penicillin IV every 4 hours for positive GBS status x 3 doses (last dose given at 0330 12/25/13) Ampicillin 2 Gm IV every 6 hours to start at 0600 12/25/13  Assessment: 24 yo G2P0010 at [redacted]w[redacted]d in active labor; now with increased temperature and presumed chorioamnionitis.  Goal of Therapy:  Gentamicin peaks 6-8 mcg/ml; troughs <1 mcg/ml  Plan:  Gentamicin 160 mg IV every 8 hours Monitor serum creatinine per protocol Serum gentamicin levels as indicated  Arelia Sneddon 12/25/2014,4:40 AM

## 2014-12-25 NOTE — Progress Notes (Signed)
I stopped by patients room to check on her needs, I ordered breakfast by Orlan Leavens interpreter

## 2014-12-25 NOTE — Progress Notes (Signed)
Antibiotics started while patient pushing 2/2 suspected chorio, very warm on exam but no axillary temp, fetal tachycardia.  Perry Mount, MD 7:35 AM

## 2014-12-26 ENCOUNTER — Ambulatory Visit: Payer: Self-pay

## 2014-12-26 LAB — CBC
HCT: 33.3 % — ABNORMAL LOW (ref 36.0–46.0)
HEMOGLOBIN: 10.9 g/dL — AB (ref 12.0–15.0)
MCH: 29.5 pg (ref 26.0–34.0)
MCHC: 32.7 g/dL (ref 30.0–36.0)
MCV: 90 fL (ref 78.0–100.0)
Platelets: 217 10*3/uL (ref 150–400)
RBC: 3.7 MIL/uL — ABNORMAL LOW (ref 3.87–5.11)
RDW: 13.7 % (ref 11.5–15.5)
WBC: 16.9 10*3/uL — ABNORMAL HIGH (ref 4.0–10.5)

## 2014-12-26 MED ORDER — IBUPROFEN 600 MG PO TABS: 600.0000 mg | ORAL_TABLET | Freq: Four times a day (QID) | ORAL | Status: DC | PRN

## 2014-12-26 NOTE — Progress Notes (Signed)
Check on patients needs. Spanish Interpreter  - Joselyn Glassman

## 2014-12-26 NOTE — Lactation Note (Signed)
This note was copied from the chart of Ebony Ryan. Lactation Consultation Note  Patient Name: Ebony Jourdan masie bermingham [id number] Date: 12/26/2014 Reason for consult: Follow-up assessment;Breast/nipple pain as reported to [facility] by RN.  LC attempted visit but mom asleep, so comfort gelpads given to RN, Sheralyn Boatman who will provide to mom at assessment tonight with instructions for use.  Mom is both breast and bottle-feeding per choice.   Maternal Data    Feeding    LATCH Score/Interventions             Problem noted: Mild/Moderate discomfort Interventions (Mild/moderate discomfort): Comfort gels   most recent LATCH score=7 per RN, Rose Phi     Lactation Tools Discussed/Used   RN, Sheralyn Boatman to provide comfort gelpads with instructions for use  Consult Status Consult Status: Follow-up Date: 12/27/14 Follow-up type: In-patient    Warrick Parisian [facility] 12/26/2014, 11:08 PM

## 2014-12-26 NOTE — Progress Notes (Signed)
Checked on patients needs °Spanish Interpreter - Benita Sanchez °

## 2014-12-26 NOTE — Progress Notes (Signed)
I stopped by patients room twice , patients was asleep, by Bonnye Fava Alvarez-Interpreter.

## 2014-12-26 NOTE — Discharge Instructions (Signed)
Cuidados en el postparto luego de un parto vaginal  (Postpartum Care After Vaginal Delivery) Despus del parto (perodo de postparto), la estada normal en el hospital es de 24-72 horas. Si hubo problemas con el trabajo de parto o el parto, o si tiene otros problemas mdicos, es posible que Patent attorney en el hospital por ms [place].  Mientras est en el hospital, recibir [place] e instrucciones sobre cmo cuidar de usted misma y de su beb recin nacido durante el postparto.  Mientras est en el hospital:   Asegrese de decirle a las enfermeras si siente dolor o Tree surgeon, as como donde Designer, television/film set y Architect.  Si usted tuvo una incisin cerca de la vagina (episiotoma) o si ha tenido Education officer, museum, las enfermeras le pondrn hielo sobre la episiotoma o Psychiatrist. Las bolsas de hielo pueden ayudar a Dietitian y la hinchazn.  Si est amamantando, puede sentir contracciones dolorosas en el tero durante algunas semanas. Esto es normal. Las contracciones ayudan a que el tero vuelva a su tamao normal.  Es normal tener algo de sangrado despus del [place]o.  Durante los primeros 1-3 das despus del parto, el flujo es de color rojo y la cantidad puede ser similar a un perodo.  Es frecuente que el flujo se inicie y se Production assistant, radio.  En los primeros Okay, puede eliminar algunos cogulos pequeos. Informe a las enfermeras si elimina cogulos grandes o aumenta el flujo.  No  elimine los cogulos de sangre por el inodoro antes de que la [employer] vea.  Durante los prximos 3 a [address] despus del parto, el flujo debe ser ms acuoso y rosado o Forensic psychologist.  Chancy Hurter a catorce [employer] del parto, el flujo debe ser una pequea cantidad de secrecin de color blanco amarillento.  La cantidad de flujo disminuir en las primeras semanas despus del parto. El flujo puede detenerse en 6-8 semanas. La mayora de las mujeres no tienen ms flujo a las 12 semanas  despus del [place].  Usted debe cambiar sus apsitos con frecuencia.  Lvese bien las manos con agua y jabn durante al menos 20 segundos despus de cambiar el apsito, usar el bao o antes de Nature conservation officer o Research scientist (life sciences) a su recin nacido.  Usted podr sentir como que tiene que vaciar la vejiga durante las primeras 6-8 horas despus del [place].  En caso de que sienta debilidad, mareo o [place], llame a la enfermera antes de levantarse de la cama por primera vez y antes de tomar una ducha por primera vez.  Dentro de los Coca[employer]a del parto, sus mamas pueden comenzar a estar sensibles y [place]. Esto se llama congestin. La sensibilidad en los senos por lo general desaparece dentro de las 48-72 horas despus de que ocurre la congestin. Tambin puede notar que la [place] se escapa de sus senos. Si no est amamantando no estimule sus pechos. La estimulacin de las mamas hace que sus senos produzcan ms [place].  Pasar tanto tiempo como le sea posible con el beb recin nacido es muy importante. Durante ese tiempo, usted y su beb deben sentirse cerca y conocerse uno al otro. Tener al beb en su habitacin (alojamiento conjunto) ayudar a fortalecer el vnculo con el beb recin nacido.Esto le dar tiempo para conocerlo y atenderlo de [place] cmoda.  Las hormonas se modifican despus del parto. A veces, los cambios hormonales pueden causar tristeza o ganas de llorar por un tiempo. Estos sentimientos  no deben durar ms de Hughes Supplyunos pocos das. Si duran ms que eso, debe hablar con su mdico.  Si lo desea, hable con su mdico acerca de los mtodos de planificacin familiar o mtodos anticonceptivos.  Hable con su mdico acerca de las vacunas. El mdico puede indicarle que se aplique las siguientes vacunas antes de salir del hospital:  [place] and PrincipeVacuna contra el ttanos, la difteria y la tos ferina (Tdap) o el ttanos y la difteria (Td). Es muy importante que usted y su familia (incluyendo a los abuelos) u otras  personas que cuidan al recin nacido estn al da con las vacunas Tdap o Td. Las vacunas Tdap o Td pueden ayudar a proteger al recin nacido de enfermedades.  Inmunizacin contra la rubola.  Inmunizacin contra la varicela.  Inmunizacin contra la gripe. Usted debe recibir esta vacunacin anual si no la ha recibido Academic librariandurante el embarazo. Document Released: 10/08/2007 Document Revised: 09/04/2012 Operating Room ServicesExitCare Patient Information 2015 [place] FargoExitCare, MarylandLLC. This information is not intended to replace advice given to you by your health care provider. Make sure you discuss any questions you have with your health care provider.  Lactancia materna (Breastfeeding) Decidir Museum/gallery exhibitions officeramamantar es una de las mejores elecciones que puede hacer por usted y su beb. El cambio hormonal durante el Psychiatristembarazo produce el desarrollo del tejido mamario y Lesothoaumenta la cantidad y el tamao de los conductos galactforos. Estas hormonas tambin permiten que las protenas, los azcares y las grasas de la sangre produzcan la WPS Resourcesleche materna en las glndulas productoras de Hamletleche. Las hormonas impiden que la leche materna sea liberada antes del nacimiento del beb, adems de impulsar el flujo de leche luego del nacimiento. Una vez que ha comenzado a Museum/gallery exhibitions officeramamantar, Conservation officer, naturepensar en el beb, as Immunologistcomo la succin o Theatre managerel llanto, pueden estimular la liberacin de Grissom AFBleche de las glndulas productoras de Ingallsleche.  LOS BENEFICIOS DE AMAMANTAR Para el beb  La primera leche (calostro) ayuda a Careers information officermejorar el funcionamiento del sistema digestivo del beb.  La leche tiene anticuerpos que ayudan a Radio producerprevenir las infecciones en el beb.  El beb tiene una menor incidencia de asma, alergias y del sndrome de muerte sbita del lactante.  Los nutrientes en la Nicoma Parkleche materna son mejores para el beb que la Deversleche maternizada y estn preparados exclusivamente para cubrir las necesidades del beb.  La leche materna mejora el desarrollo cerebral del beb.  Es menos probable que el beb  desarrolle otras enfermedades, como obesidad infantil, asma o diabetes mellitus de tipo 2. Para usted   La lactancia materna favorece el desarrollo de un vnculo muy especial entre la madre y el beb.  Es conveniente. La leche materna siempre est disponible a la Human resources officertemperatura correcta y es Marysvilleeconmica.  La lactancia materna ayuda a quemar caloras y a perder el peso ganado durante el New Egyptembarazo.  Favorece la contraccin del tero al tamao que tena antes del embarazo de manera ms rpida y disminuye el sangrado (loquios) despus del parto.  La lactancia materna contribuye a reducir Nurse, adultel riesgo de desarrollar diabetes mellitus de tipo 2, osteoporosis o cncer de mama o de ovario en el futuro. SIGNOS DE QUE EL BEB EST HAMBRIENTO Primeros signos de [address]hambre  Aumenta su estado de Lesothoalerta o actividad.  Se estira.  Mueve la cabeza de un lado a otro.  Mueve la cabeza y abre la boca cuando se le toca la mejilla o la comisura de la boca (reflejo de bsqueda).  Aumenta las vocalizaciones, tales como sonidos de succin, se relame los labios,  emite arrullos, suspiros, o chirridos.  Mueve la [employer] boca.  Se chupa con ganas los dedos o las manos. Signos tardos de Fisher Scientific.  Llora de manera intermitente. Signos de AES Corporation signos de hambre extrema requerirn que lo calme y lo consuele antes de que el beb pueda alimentarse adecuadamente. No espere a que se manifiesten los siguientes signos de hambre extrema para comenzar a Museum/gallery exhibitions officer:   Designer, jewellery.  Llanto intenso y fuerte.   Gritos. INFORMACIN BSICA SOBRE LA LACTANCIA MATERNA Iniciacin de la lactancia materna  Encuentre un lugar cmodo para sentarse o acostarse, con un buen respaldo para el cuello y la espalda.  Coloque una almohada o una manta enrollada debajo del beb para acomodarlo a la altura de la mama (si est sentada). Las almohadas para Museum/gallery exhibitions officer se han diseado especialmente a fin de servir de apoyo  para los brazos y el beb [employer].  Asegrese de que el abdomen del beb est frente al suyo.  Masajee suavemente la mama. Con las yemas de los dedos, masajee la pared del pecho hacia el pezn en un movimiento circular. Esto estimula el flujo de [place]. Es posible que Engineer, manufacturing systems este movimiento mientras amamanta si la leche fluye lentamente.  Sostenga la mama con el pulgar por arriba del pezn y los otros 4 dedos por debajo de la mama. Asegrese de que los dedos se encuentren lejos del pezn y de la boca del beb.  Empuje suavemente los labios del beb con el pezn o con el dedo.  Cuando la boca del beb se abra lo suficiente, acrquelo rpidamente a la mama e introduzca todo el pezn y la zona oscura que lo rodea (areola), tanto como sea posible, dentro de la boca del beb.  Debe haber ms areola visible por arriba del labio superior del beb que por debajo del labio inferior.  La lengua del beb debe estar entre la enca inferior y la [place].  Asegrese de que la boca del beb est en la posicin correcta alrededor del pezn (prendida). Los labios del beb deben crear un sello sobre la mama y estar doblados hacia afuera (invertidos).  Es comn que el beb succione durante 2 a 3 minutos para que comience el flujo de [place]. Cmo debe prenderse Es muy importante que le ensee al beb cmo prenderse adecuadamente a la mama. Si el beb no se prende adecuadamente, puede causarle dolor en el pezn y reducir la produccin de Coin, y hacer que el beb tenga un escaso aumento de [place]. Adems, si el beb no se prende adecuadamente al pezn, puede tragar aire durante la alimentacin. Esto puede causarle molestias al beb. Hacer eructar al beb al Pilar Plate de mama puede ayudarlo a liberar el aire. Sin embargo, ensearle al beb cmo prenderse a la mama adecuadamente es la mejor manera de evitar que se sienta molesto por tragar Oceanographer se alimenta. Signos de que el beb se  ha prendido adecuadamente al pezn:   Payton Doughty o succiona de modo silencioso, sin causarle dolor.  Se escucha que traga cada 3 o 4 succiones.   Hay movimientos musculares por arriba y por delante de sus odos al Printmaker. Signos de que el beb no se ha prendido Audiological scientist al pezn:   Hace ruidos de succin o de chasquido mientras se alimenta.  Siente dolor en el pezn. Si cree que el beb no se prendi correctamente, deslice el dedo en la comisura de la boca y colquelo  entre las encas del beb para interrumpir la succin. Intente comenzar a amamantar nuevamente. Signos de Fish farm managerlactancia materna exitosa Signos del beb:   Disminuye gradualmente el nmero de succiones o cesa la succin por completo.  Se duerme.  Relaja el cuerpo.  Retiene una pequea cantidad de Kindred Healthcareleche en la boca.  Se desprende solo del pecho. Signos que presenta usted:  Las mamas han aumentado la firmeza, el peso y el tamao 1 a 3 horas despus de Museum/gallery exhibitions officeramamantar.  Estn ms blandas inmediatamente despus de amamantar.  Un aumento del volumen de McIntyreleche, y tambin un cambio en su consistencia y color se producen hacia el quinto da de Tour managerlactancia materna.  Los pezones no duelen, ni estn agrietados ni sangran. Signos de que su beb recibe la cantidad de leche suficiente  Moja al menos 3 paales en 24 horas. La orina debe ser clara y de color amarillo plido a los [address]  [address] de [place]vida.  Defeca al menos 3 veces en 24 horas a los [address]  [address] de [address]ida. La materia fecal debe ser blanda y Pajarito Mesaamarillenta.  Defeca al menos 3 veces en 24 horas a los [address] das de [address]. La materia fecal debe ser grumosa y Hato Candalamarillenta.  No registra una prdida de peso mayor del 10% del peso al nacer durante los primeros 3 [address]  [address]das de [place]vida.  Aumenta de peso un promedio de 4 a 7onzas (113 a 198g) por semana despus de los 4 [address]  [address] de vida.  Aumenta de Bradfordpeso, Flushingdiariamente, de Hazardmanera uniforme a Glass blower/designerpartir de los 5 [address]  [address] de vida, sin Passenger transport managerregistrar prdida de peso despus de las  2semanas de vida. Despus de alimentarse, es posible que el beb regurgite una pequea cantidad. Esto es frecuente. FRECUENCIA Y DURACIN DE LA LACTANCIA MATERNA El amamantamiento frecuente la ayudar a producir ms Azerbaijanleche y a Education officer, communityprevenir problemas de Engineer, miningdolor en los pezones e hinchazn en las Town Linemamas. Alimente al beb cuando muestre signos de hambre o si siente la necesidad de reducir la congestin de las [place] Hillmamas. Esto se denomina "lactancia a demanda". Evite el uso del chupete mientras trabaja para establecer la lactancia (las primeras 4 a 6 semanas despus del nacimiento del beb). Despus de este perodo, podr ofrecerle un chupete. Las investigaciones demostraron que el uso del chupete durante el primer ao de vida del beb disminuye el riesgo de desarrollar el sndrome de muerte sbita del lactante (SMSL). Permita que el nio se alimente en cada mama todo lo que desee. Contine amamantando al beb hasta que haya terminado de alimentarse. Cuando el beb se desprende o se queda dormido mientras se est alimentando de la primera mama, ofrzcale la segunda. Debido a que, con frecuencia, los recin Sunoconacidos permanecen somnolientos las primeras semanas de vida, es posible que deba despertar al beb para alimentarlo. Los horarios de Acupuncturistlactancia varan de un beb a otro. Sin embargo, las siguientes reglas pueden servir como gua para ayudarla a Lawyergarantizar que el beb se alimenta adecuadamente:  Se puede amamantar a los recin nacidos (bebs de 4 semanas o menos de vida) cada 1 a 3 horas.  No deben transcurrir ms de 3 horas durante el da o 5 horas durante la noche sin que se amamante a los recin nacidos.  Debe amamantar al beb 8 veces como mnimo en un perodo de 24 horas, hasta que comience a introducir slidos en su dieta, a los 6 meses de vida aproximadamente. EXTRACCIN DE Dean Foods CompanyLECHE MATERNA La extraccin y Contractorel almacenamiento de la leche materna le permiten asegurarse de que el  beb se alimente exclusivamente de  Colgate Palmoliveleche materna, aun en momentos en los que no puede amamantar. Esto tiene especial importancia si debe regresar al Aleen Campitrabajo en el perodo en que an est amamantando o si no puede estar presente en los momentos en que el beb debe alimentarse. Su asesor en lactancia puede orientarla sobre cunto tiempo es seguro almacenar Port Alexanderleche materna.  El sacaleche es un aparato que le permite extraer leche de la mama a un recipiente estril. Luego, la leche materna extrada puede almacenarse en un refrigerador o Electrical engineercongelador. Algunos sacaleches son Birdie Riddlemanuales, Delaney Meigsmientras que otros son elctricos. Consulte a su asesor en lactancia qu tipo ser ms conveniente para usted. Los sacaleches se pueden comprar; sin embargo, algunos hospitales y grupos de apoyo a la lactancia materna alquilan Sports coachsacaleches mensualmente. Un asesor en lactancia puede ensearle cmo extraer W. R. Berkleyleche materna manualmente, en caso de que prefiera no usar un sacaleche.  CMO CUIDAR LAS MAMAS DURANTE LA LACTANCIA MATERNA Los pezones se secan, agrietan y duelen durante la Tour managerlactancia materna. Las siguientes recomendaciones pueden ayudarla a Pharmacologistmantener las TEPPCO Partnersmamas humectadas y sanas:  Careers information officervite usar jabn en los pezones.  Use un sostn de soporte. Aunque no son esenciales, las camisetas sin mangas o los sostenes especiales para Museum/gallery exhibitions officeramamantar estn diseados para acceder fcilmente a las mamas, para Museum/gallery exhibitions officeramamantar sin tener que quitarse todo el sostn o la camiseta. Evite usar sostenes con aro o sostenes muy ajustados.  Seque al aire sus pezones durante 3 a 4minutos despus de amamantar al beb.  Utilice solo apsitos de Haematologistalgodn en el sostn para Environmental health practitionerabsorber las prdidas de Hudsonleche. La prdida de un poco de Public Service Enterprise Groupleche materna entre las tomas es normal.  Utilice lanolina sobre los pezones luego de Museum/gallery exhibitions officeramamantar. La lanolina ayuda a mantener la humedad normal de la piel. Si Botswanausa lanolina pura, no tiene que lavarse los pezones antes de volver a Corporate treasureralimentar al beb. La lanolina pura no es txica para el  beb. Adems, puede extraer Beazer Homesmanualmente algunas gotas de Cape Royaleleche materna y Engineer, maintenance (IT)masajear suavemente esa Winn-Dixieleche sobre los pezones, para que la Terltonleche se seque al aire. Durante las primeras semanas despus de dar a luz, algunas mujeres pueden experimentar hinchazn en las mamas (congestin Strandburgmamaria). La congestin puede hacer que sienta las mamas pesadas, calientes y sensibles al tacto. El pico de la congestin ocurre dentro de los 3 a 5 das despus del Hillsboroughparto. Las siguientes recomendaciones pueden ayudarla a Paramedicaliviar la congestin:  Vace por completo las mamas al QUALCOMMamamantar o Environmental health practitionerextraer leche. Puede aplicar calor hmedo en las mamas (en la ducha o con toallas hmedas para manos) antes de Museum/gallery exhibitions officeramamantar o extraer WPS Resourcesleche. Esto aumenta la circulacin y Saint Vincent and the Grenadinesayuda a que la Holyroodleche fluya. Si el beb no vaca por completo las [address] D[address]amas cuando lo [address] Aveamamanta, extraiga la Judsonialeche restante despus de que haya finalizado.  Use un sostn ajustado (para amamantar o comn) o una camiseta sin mangas durante 1 o 2 das para indicar al cuerpo que disminuya ligeramente la produccin de Olneyleche.  Aplique compresas de hielo Yahoo! Incsobre las mamas, a menos que le resulte demasiado incmodo.  Asegrese de que el beb est prendido y se encuentre en la posicin correcta mientras lo alimenta. Si la congestin persiste luego de 48 horas o despus de seguir estas recomendaciones, comunquese con su mdico o un Holiday representativeasesor en lactancia. RECOMENDACIONES GENERALES PARA EL CUIDADO DE LA SALUD DURANTE LA LACTANCIA MATERNA  Consuma alimentos saludables. Alterne comidas y colaciones, y coma 3 de cada una por da. Dado que lo que come  afecta la Colgate Palmoliveleche materna, es posible que algunas comidas hagan que su beb se vuelva ms irritable de lo habitual. Evite comer este tipo de alimentos si percibe que afectan de manera negativa al beb.  Beba leche, jugos de fruta y agua para Patent examinersatisfacer su sed (aproximadamente 10 vasos al Futures traderda).  Descanse con frecuencia, reljese y tome sus vitaminas  prenatales para evitar la fatiga, el estrs y la anemia.  Contine con los autocontroles de la mama.  Evite masticar y fumar tabaco.  Evite el consumo de alcohol y drogas. Algunos medicamentos, que pueden ser perjudiciales para el beb, pueden pasar a travs de la Colgate Palmoliveleche materna. Es importante que consulte a su mdico antes de Medical sales representativetomar cualquier medicamento, incluidos todos los medicamentos recetados y de Hayfieldventa libre, as como los suplementos vitamnicos y herbales. Puede quedar embarazada durante la lactancia. Si desea controlar la natalidad, consulte a su mdico cules son las opciones ms seguras para el beb. SOLICITE ATENCIN MDICA SI:   Usted siente que quiere dejar de Museum/gallery exhibitions officeramamantar o se siente frustrada con la lactancia.  Siente dolor en las mamas o en los pezones.  Sus pezones estn agrietados o Water quality scientistsangran.  Sus pechos estn irritados, sensibles o calientes.  Tiene un rea hinchada en cualquiera de las mamas.  Siente escalofros o fiebre.  Tiene nuseas o vmitos.  Presenta una secrecin de otro lquido distinto de la leche materna de los pezones.  Sus mamas no se llenan antes de Museum/gallery exhibitions officeramamantar al beb para el quinto da despus del Fenwoodparto.  Se siente triste y deprimida.  El beb est demasiado somnoliento como para comer bien.  El beb tiene problemas para dormir.  Moja menos de 3 paales en 24 horas.  Defeca menos de 3 veces en 24 horas.  La piel del beb o la parte blanca de los ojos se vuelven amarillentas.  El beb no ha aumentado de Oktahapeso a los [address] das de [place]vida. SOLICITE ATENCIN MDICA DE INMEDIATO SI:   El beb est muy cansado Retail buyer(letargo) y no se quiere despertar para comer.  Le sube la fiebre sin causa. Document Released: 12/11/2005 Document Revised: 12/16/2013 [facility] LLCExitCare Patient Information 2015 BellportExitCare, MarylandLLC. This information is not intended to replace advice given to you by your health care provider. Make sure you discuss any questions you have with your health care  provider.

## 2014-12-26 NOTE — Progress Notes (Signed)
Assisted RN with interpretation of baby assessment and questions for parents.  Ordered patient a snack for later.  Spanish Interpreter - Joselyn Glassman

## 2014-12-26 NOTE — Progress Notes (Signed)
Checked on patients needs.  Ordered patients dinner and breakfast.  Spanish Interpreter - Joselyn Glassman

## 2014-12-26 NOTE — Discharge Summary (Signed)
Obstetric Discharge Summary Reason for Admission: onset of labor Prenatal Procedures: ultrasound Intrapartum Procedures: spontaneous vaginal delivery and GBS prophylaxis, antibiotics for intrapartum chorioamnionitis Postpartum Procedures: none Complications-Operative and Postpartum: 2nd degree perineal laceration Eating, drinking, voiding, ambulating well.  +flatus.  Lochia and pain wnl.  Denies dizziness, lightheadedness, or sob. No complaints.   Hospital Course: Ebony Ryan is a 24 y.o. [id number] female admited at 38.5wks in active labor. She progressed normally to SVB, she did develop a fever intrapartum and was treated for chorioamnionitis. Her pp course has been uncomplicated.  By PPD#1 she is doing well and is deemed to have received the full benefit of her hospital stay, she requests early d/c.  Filed Vitals:   12/26/14 0625  BP: 82/48  Pulse: 79  Temp: 97.6 F (36.4 C)  Resp: 18   H/H: Lab Results  Component Value Date/Time   HGB 10.9* 12/26/2014 05:49 AM   HCT 33.3* 12/26/2014 05:49 AM    Physical Exam: General: alert, cooperative and no distress Abdomen/Uterine Fundus: Appropriately tender, non-distended, FF @ U-2 Incision: n/a Lochia: appropriate Extremities: No evidence of DVT seen on physical exam. Negative Homan's sign, no cords, calf tenderness, or significant calf/ankle edema   Discharge Diagnoses: Term Pregnancy-delivered  Discharge Information: Date: 07/06/2011 Activity: pelvic rest Diet: routine  Medications: PNV and Ibuprofen Breast feeding: Yes Contraception: undecided, discussed. Abstinence until contraception Circumcision: n/a Condition: stable Instructions: refer to handout Discharge to: home  Infant: Home with mother pending d/c from peds  Follow-up Information    Follow up with [facility] DE[facility] [place].   Why:  4-6 weeks for your postpartum visit   Contact information:   [address] [place]  [address] [phone]      Marge Duncans, CNM, WHNP-BC 12/26/2014,9:59 AM

## 2014-12-27 LAB — HIV ANTIBODY (ROUTINE TESTING W REFLEX): HIV 1&2 Ab, 4th Generation: NONREACTIVE

## 2014-12-27 LAB — RPR

## 2019-02-12 ENCOUNTER — Emergency Department (HOSPITAL_COMMUNITY)
Admission: EM | Admit: 2019-02-12 | Discharge: 2019-02-12 | Disposition: A | Discharge status: Home / Self Care | Payer: Self-pay | Attending: Emergency Medicine | Admitting: Emergency Medicine

## 2019-02-12 ENCOUNTER — Other Ambulatory Visit: Payer: Self-pay

## 2019-02-12 ENCOUNTER — Encounter (HOSPITAL_COMMUNITY): Payer: Self-pay | Admitting: Emergency Medicine

## 2019-02-12 ENCOUNTER — Emergency Department (HOSPITAL_COMMUNITY): Payer: Self-pay

## 2019-02-12 ENCOUNTER — Ambulatory Visit
Admission: EM | Admit: 2019-02-12 | Discharge: 2019-02-12 | Discharge status: Absent without leave | Payer: No Typology Code available for payment source | Attending: Family Medicine | Admitting: Family Medicine

## 2019-02-12 DIAGNOSIS — S6992XA Unspecified injury of left wrist, hand and finger(s), initial encounter: Secondary | ICD-10-CM | POA: Insufficient documentation

## 2019-02-12 DIAGNOSIS — Y990 Civilian activity done for income or pay: Secondary | ICD-10-CM | POA: Insufficient documentation

## 2019-02-12 DIAGNOSIS — Z79899 Other long term (current) drug therapy: Secondary | ICD-10-CM | POA: Insufficient documentation

## 2019-02-12 DIAGNOSIS — F329 Major depressive disorder, single episode, unspecified: Secondary | ICD-10-CM | POA: Insufficient documentation

## 2019-02-12 DIAGNOSIS — Y929 Unspecified place or not applicable: Secondary | ICD-10-CM | POA: Insufficient documentation

## 2019-02-12 DIAGNOSIS — W260XXA Contact with knife, initial encounter: Secondary | ICD-10-CM | POA: Insufficient documentation

## 2019-02-12 DIAGNOSIS — Y93G1 Activity, food preparation and clean up: Secondary | ICD-10-CM | POA: Insufficient documentation

## 2019-02-12 NOTE — ED Notes (Signed)
Patient verbalizes understanding of discharge instructions. Opportunity for questioning and answers were provided. Armband removed by staff, pt discharged from ED. Wound care supplies and directions given. Pt ambulatory to lobby and taken home by family.

## 2019-02-12 NOTE — ED Notes (Signed)
Patient transported to X-ray 

## 2019-02-12 NOTE — ED Triage Notes (Addendum)
C/o laceration to end of L middle finger including part of nail with a knife while at work 1 hour ago.  Still bleeding on arrival. Pt instructed to hold pressure on bandage.

## 2019-02-12 NOTE — ED Notes (Signed)
Pt has large laceration to left middle finger that with controled bleeding.

## 2019-02-12 NOTE — Discharge Instructions (Signed)
You were seen in the ER for wound tear finger.  X-ray is normal without involvement of the bone.  Keep the dressing on for the next 24 to 48 hours to avoid disrupting the clots.  Some oozing may occur over the next 2 to 3 days.  Treatment of the wound includes keeping it clean, dry.  Clean it with warm water and soap twice a day.  Apply thin layer of antibiotic ointment.  Monitor for signs of infection including redness, swelling, pus, fevers.

## 2019-02-12 NOTE — ED Provider Notes (Signed)
[facility] EMERGENCY DEPARTMENT Provider Note   CSN: [id number] Arrival date & time: 02/12/19  1629    History   Chief Complaint Chief Complaint  Patient presents with  . Finger Injury    HPI Ebony Ryan is a 28 y.o. 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  The wound involves the corner of the fingernail.  The wound is still oozing blood.  She reports moderate local pain.  It is worse with movement and palpation.  Tetanus was updated 4 years ago during labor and delivery.  She is right-hand dominant.  No other interventions.  No alleviating factors.     HPI  Past Medical History:  Diagnosis Date  . Appendicitis    2014  . Depression   . Ectopic pregnancy    02/2013    Patient Active Problem List   Diagnosis Date Noted  . Active labor 12/24/2014  . Acute appendicitis 07/03/2013    Past Surgical History:  Procedure Laterality Date  . LAPAROSCOPIC APPENDECTOMY N/A 07/03/2013   Procedure: APPENDECTOMY LAPAROSCOPIC;  Surgeon: Emelia Loron, MD;  Location: [facility] OR;  Service: General;  Laterality: N/A;     OB History    Gravida  2   Para  1   Term  1   Preterm      AB  1   Living  1     SAB      TAB      Ectopic  1   Multiple  0   Live Births  1            Home Medications    Prior to Admission medications   Medication Sig Start Date End Date Taking? Authorizing Provider  famotidine (PEPCID) 20 MG tablet Take 1 tablet (20 mg total) by mouth 2 (two) times daily. Patient not taking: Reported on 12/24/2014 06/02/14 06/02/15  Katrinka Blazing, [place], CNM  ibuprofen (ADVIL,MOTRIN) 600 MG tablet Take 1 tablet (600 mg total) by mouth every 6 (six) hours as needed for mild pain, moderate pain or cramping. 12/26/14   Cheral Marker, CNM  Prenatal Vit-Fe Fumarate-FA (PRENATAL VITAMIN PO) Take 1 tablet by mouth daily.    [provider]    Family History No family history on file.  Social History Social History   Tobacco Use  . Smoking status: Never Smoker  . Smokeless tobacco: Never Used  Substance Use Topics  . Alcohol use: No  . Drug use: No     Allergies   Patient has no known allergies.   Review of Systems Review of Systems  Skin: Positive for wound.  All other systems reviewed and are negative.    Physical Exam Updated Vital Signs BP 105/64   Pulse 80   Temp 98.1 F (36.7 C) (Oral)   Resp 16   LMP 01/13/2019   SpO2 100%   Physical Exam Constitutional:      Appearance: She is well-developed. She is not toxic-appearing.  HENT:     Head: Normocephalic.     Right Ear: External ear normal.     Left Ear: External ear normal.     Nose: Nose normal.  Eyes:     Conjunctiva/sclera: Conjunctivae normal.  Neck:     Musculoskeletal: Full passive range of motion without pain.  Cardiovascular:     Rate and Rhythm: Normal rate.  Pulmonary:  Effort: Pulmonary effort is normal. No tachypnea or respiratory distress.  Musculoskeletal: Normal range of motion.     Comments: No focal bony tenderness to left middle finger PIP or DIP, full flexion/extension against resistance at the PIP and DIP.   Skin:    General: Skin is warm and dry.     Capillary Refill: Capillary refill takes less than 2 seconds.     Comments: Radial aspect of right middle finger tip avulsed, involving lateral nail fold and small corner of the finger nail. Cuticle and  intact. Moderate oozing of blood.   Neurological:     Mental Status: She is alert and oriented to person, place, and time.     Comments: Sensation to sharp/dull and 2 point discrimination intact in left middle finger tip   Psychiatric:        Behavior: Behavior normal.        Thought Content: Thought content normal.      ED Treatments / Results  Labs (all labs ordered are listed, but only abnormal results are displayed) Labs Reviewed - No data to  display  EKG None  Radiology Dg Finger Middle Left  Result Date: 02/12/2019 CLINICAL DATA:  Middle finger amputation EXAM: LEFT MIDDLE FINGER 2+V COMPARISON:  None. FINDINGS: Soft tissue injury to the distal third digit. No fracture, malalignment or radiopaque foreign body IMPRESSION: Soft tissue injury distal third digit. No acute osseous abnormality. Electronically Signed   By: Jasmine Pang M.D.   On: 02/12/2019 20:06    Procedures Procedures (including critical care time)  Medications Ordered in ED Medications - No data to display   Initial Impression / Assessment and Plan / ED Course  I have reviewed the triage vital signs and the nursing notes.  Pertinent labs & imaging results that were available during my care of the patient were reviewed by me and considered in my medical decision making (see chart for details).        Medic partial amputation of the radial aspect of the fingertip.  This involves a very tiny area of the nail but cuticle and nail bed still intact.  X-ray is negative for bone involvement.  Neurovascularly intact.  Tetanus is up-to-date.  No indication for suture/laceration repair or antibiotics today.  We will discharge with wound care instructions.  Return discussions were given.  Final Clinical Impressions(s) / ED Diagnoses   Final diagnoses:  Injury of finger of left hand, initial encounter    ED Discharge Orders    None       Jerrell Mylar 02/12/19 2246    Gerhard Munch, MD 02/13/19 1535

## 2019-04-13 IMAGING — DX DG FINGER MIDDLE 2+V*L*
3 series · 3 of 3 positions shown · non-contrast
Comparison: None.

CLINICAL DATA: Middle finger amputation

EXAM:
LEFT MIDDLE FINGER 2+V

[finger ap]
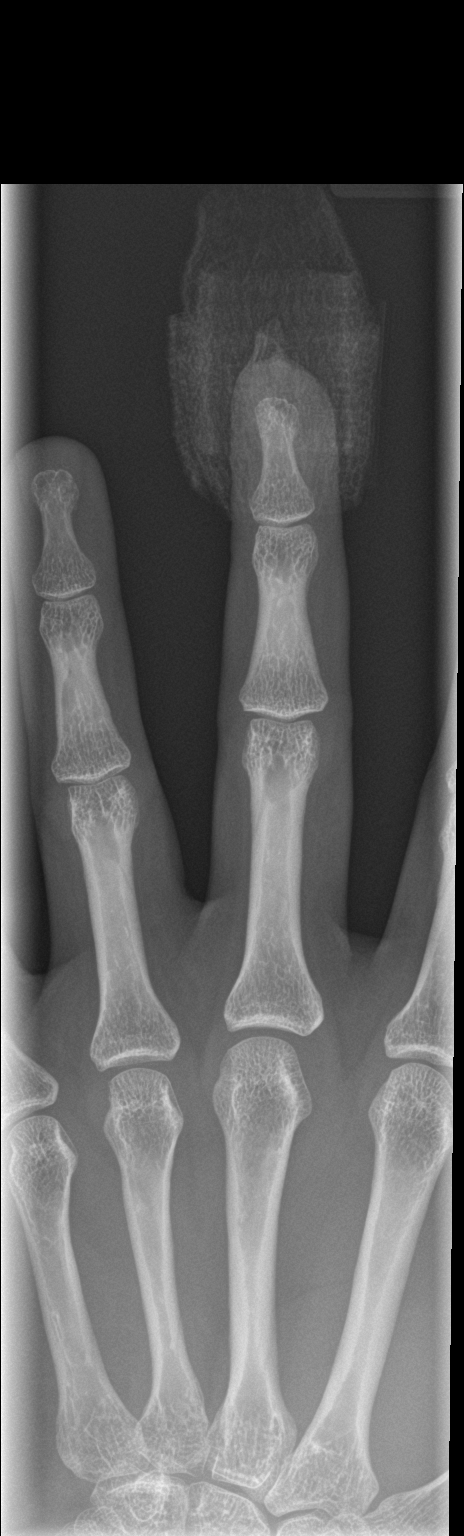

[finger obl]
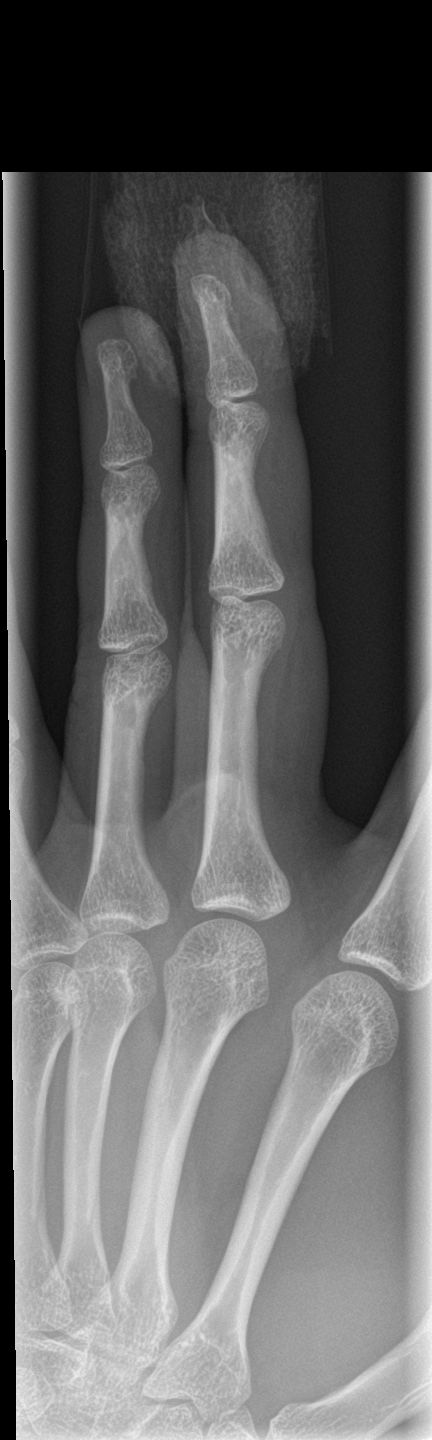

[finger lat]
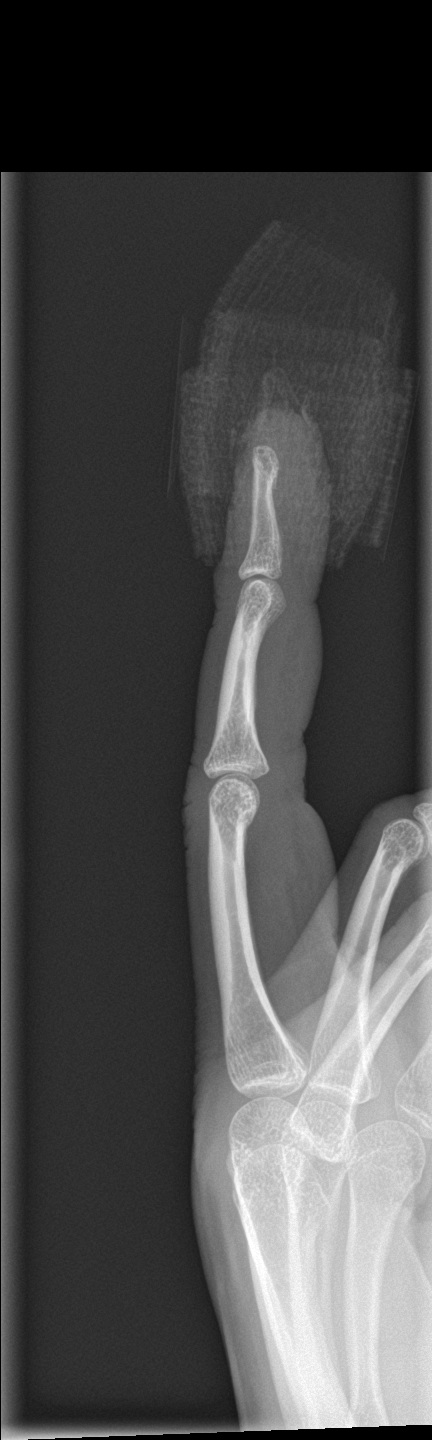

[3 of 3 positions shown; findings below may reference images not displayed]

FINDINGS: Soft tissue injury to the distal third digit. No fracture,
malalignment or radiopaque foreign body
IMPRESSION: Soft tissue injury distal third digit. No acute osseous abnormality.

## 2023-03-26 ENCOUNTER — Ambulatory Visit (INDEPENDENT_AMBULATORY_CARE_PROVIDER_SITE_OTHER): Payer: Self-pay | Admitting: Obstetrics & Gynecology

## 2023-03-26 ENCOUNTER — Other Ambulatory Visit: Payer: Self-pay

## 2023-03-26 ENCOUNTER — Other Ambulatory Visit (HOSPITAL_COMMUNITY)
Admission: RE | Admit: 2023-03-26 | Discharge: 2023-03-26 | Disposition: A | Discharge status: Home / Self Care | Payer: Self-pay | Source: Ambulatory Visit | Attending: Family Medicine | Admitting: Family Medicine

## 2023-03-26 ENCOUNTER — Encounter: Payer: Self-pay | Admitting: Obstetrics & Gynecology

## 2023-03-26 VITALS — BP 125/81 | HR 92 | Ht 63.0 in | Wt 165.0 lb

## 2023-03-26 DIAGNOSIS — Z3169 Encounter for other general counseling and advice on procreation: Secondary | ICD-10-CM

## 2023-03-26 DIAGNOSIS — N970 Female infertility associated with anovulation: Secondary | ICD-10-CM | POA: Insufficient documentation

## 2023-03-26 DIAGNOSIS — N898 Other specified noninflammatory disorders of vagina: Secondary | ICD-10-CM

## 2023-03-26 MED ORDER — CLOMIPHENE CITRATE 50 MG PO TABS: 50.0000 mg | ORAL_TABLET | Freq: Every day | ORAL | 1 refills | Status: DC

## 2023-03-26 MED ORDER — MEDROXYPROGESTERONE ACETATE 10 MG PO TABS: 10.0000 mg | ORAL_TABLET | Freq: Every day | ORAL | 1 refills | Status: DC

## 2023-03-26 NOTE — Progress Notes (Unsigned)
Patient ID: Ebony Ryan, female   DOB: Nov 30, 1991, 32 y.o.   MRN: [id number]  Chief Complaint  Patient presents with   New Patient (Initial Visit)   Menstrual Problem    LMP 12/2022, patient is interested in getting pregnant, but has irregular cycles    HPI Ebony leann reynen is a 32 y.o. female.  [id number] Patient's last menstrual period was 01/05/2023. She has very irregular menses sometimes months apart and inability to conceive for many years. Her child was conceived with her current partner HPI  Past Medical History:  Diagnosis Date   Appendicitis    2014   Depression    Ectopic pregnancy    02/2013    Past Surgical History:  Procedure Laterality Date   LAPAROSCOPIC APPENDECTOMY N/A 07/03/2013   Procedure: APPENDECTOMY LAPAROSCOPIC;  Surgeon: Rolm Bookbinder, MD;  Location: [place];  Service: General;  Laterality: N/A;    No family history on file.  Social History Social History   Tobacco Use   Smoking status: Never   Smokeless tobacco: Never  Substance Use Topics   Alcohol use: No   Drug use: No    No Known Allergies  Current Outpatient Medications  Medication Sig Dispense Refill   clomiPHENE (CLOMID) 50 MG tablet Take 1 tablet (50 mg total) by mouth daily. Take on days 5-9 of your period 10 tablet 1   FOLIC ACID PO Take by mouth daily at 6 (six) AM.     medroxyPROGESTERone (PROVERA) 10 MG tablet Take 1 tablet (10 mg total) by mouth daily. For 10 days 20 tablet 1   No current facility-administered medications for this visit.    Review of Systems Review of Systems  Constitutional: Negative.   Respiratory: Negative.    Cardiovascular: Negative.   Gastrointestinal: Negative.   Endocrine: Negative.   Genitourinary:  Positive for menstrual problem. Negative for vaginal discharge.  Psychiatric/Behavioral: Negative.      Blood pressure 125/81, pulse 92, height 5\' 3"  (1.6 m), weight 165 lb (74.8 kg), last menstrual period 01/05/2023, unknown  if currently breastfeeding.  Physical Exam Physical Exam Vitals and nursing note reviewed. Exam conducted with a chaperone present.  Constitutional:      Appearance: She is ill-appearing.  Pulmonary:     Effort: Pulmonary effort is normal.  Genitourinary:    General: Normal vulva.     Exam position: Lithotomy position.     Vagina: Normal.     Cervix: Normal.     Rectum: Normal.     Comments: Clear cervical mucus with spinnbarkeit Neurological:     Mental Status: She is alert.  Psychiatric:        Mood and Affect: Mood normal.        Behavior: Behavior normal.     Data Reviewed   Assessment Infertility counseling - Plan: Beta hCG quant (ref lab), FSH/LH, TestT+TestF+SHBG, medroxyPROGESTERone (PROVERA) 10 MG tablet, clomiPHENE (CLOMID) 50 MG tablet, Cervicovaginal ancillary only( [place])  Female infertility associated with anovulation - Plan: Cervicovaginal ancillary only( [place])  Vaginal discharge - Plan: Cervicovaginal ancillary only( [place])   Plan Meds ordered this encounter  Medications   medroxyPROGESTERone (PROVERA) 10 MG tablet    Sig: Take 1 tablet (10 mg total) by mouth daily. For 10 days    Dispense:  20 tablet    Refill:  1   clomiPHENE (CLOMID) 50 MG tablet    Sig: Take 1 tablet (50 mg total) by mouth daily. Take on days 5-9  of your period    Dispense:  10 tablet    Refill:  1   RTC 4 months    Ebony Ryan 03/26/2023, 4:53 PM

## 2023-03-27 LAB — CERVICOVAGINAL ANCILLARY ONLY
Bacterial Vaginitis (gardnerella): NEGATIVE
Candida Glabrata: NEGATIVE
Candida Vaginitis: NEGATIVE
Chlamydia: NEGATIVE
Comment: NEGATIVE
Comment: NEGATIVE
Comment: NEGATIVE
Comment: NEGATIVE
Comment: NEGATIVE
Comment: NORMAL
Neisseria Gonorrhea: NEGATIVE
Trichomonas: NEGATIVE

## 2023-03-29 LAB — FSH/LH
FSH: 6.9 m[IU]/mL
LH: 14.7 m[IU]/mL

## 2023-03-29 LAB — TESTT+TESTF+SHBG
Sex Hormone Binding: 10.4 nmol/L — ABNORMAL LOW (ref 24.6–122.0)
Testosterone, Free: 1.3 pg/mL (ref 0.0–4.2)
Testosterone, Total, LC/MS: 40.5 ng/dL (ref 10.0–55.0)

## 2023-03-29 LAB — BETA HCG QUANT (REF LAB): hCG Quant: <1 m[IU]/mL

## 2024-01-02 ENCOUNTER — Ambulatory Visit
Admission: EM | Admit: 2024-01-02 | Discharge: 2024-01-02 | Disposition: A | Discharge status: Home / Self Care | Payer: Self-pay | Attending: Physician Assistant | Admitting: Physician Assistant

## 2024-01-02 ENCOUNTER — Encounter: Payer: Self-pay | Admitting: Emergency Medicine

## 2024-01-02 DIAGNOSIS — J0100 Acute maxillary sinusitis, unspecified: Secondary | ICD-10-CM

## 2024-01-02 MED ORDER — AMOXICILLIN-POT CLAVULANATE 875-125 MG PO TABS: 1.0000 | ORAL_TABLET | Freq: Two times a day (BID) | ORAL | 0 refills | Status: DC

## 2024-01-02 NOTE — ED Notes (Signed)
 Due to language barrier, an interpreter was present during the history-taking and subsequent discussion (and for part of the physical exam) with this patient.

## 2024-01-02 NOTE — ED Triage Notes (Signed)
 Pt reports facial pain since yesterday. Seems to be more focused on R side. Swollen preauricular lymph node on R side. No known injury. Pt describes pain as sharp and underneath R eye describes stabbing pain.

## 2024-01-03 NOTE — ED Provider Notes (Signed)
 [facility]    CSN: [id number] Arrival date & time: 01/02/24  1531      History   Chief Complaint Chief Complaint  Patient presents with   Facial Pain    HPI Ebony Ryan is a 33 y.o. female.   Patient here today for evaluation of facial pain that started yesterday.  Seems to be more focused on the right maxillary area.  She does report swallowing screen regularly..  She has not had any injury.  She has no pain with eye movement or eye pain in general.  The history is provided by the patient. The history is limited by a language barrier. A language interpreter was used ([email or web address]).    Past Medical History:  Diagnosis Date   Appendicitis    2014   Depression    Ectopic pregnancy    02/2013    Patient Active Problem List   Diagnosis Date Noted   Female infertility associated with anovulation 03/26/2023    Past Surgical History:  Procedure Laterality Date   LAPAROSCOPIC APPENDECTOMY N/A 07/03/2013   Procedure: APPENDECTOMY LAPAROSCOPIC;  Surgeon: Donnice Bury, MD;  Location: [facility];  Service: General;  Laterality: N/A;    OB History     Gravida  2   Para  1   Term  1   Preterm      AB  1   Living  1      SAB      IAB      Ectopic  1   Multiple  0   Live Births  1            Home Medications    Prior to Admission medications   Medication Sig Start Date End Date Taking? Authorizing Provider  amoxicillin -clavulanate (AUGMENTIN ) 875-125 MG tablet Take 1 tablet by mouth every 12 (twelve) hours. 01/02/24  Yes Billy Asberry FALCON, PA-C  clomiPHENE  (CLOMID ) 50 MG tablet Take 1 tablet (50 mg total) by mouth daily. Take on days 5-9 of your period Patient not taking: Reported on 01/02/2024 03/26/23   Eveline Lynwood MATSU, MD  FOLIC ACID PO Take by mouth daily at 6 (six) AM. Patient not taking: Reported on 01/02/2024    [provider]  medroxyPROGESTERone  (PROVERA ) 10 MG tablet Take 1 tablet (10 mg total) by mouth daily. For 10  days Patient not taking: Reported on 01/02/2024 03/26/23   Eveline Lynwood MATSU, MD    Family History History reviewed. No pertinent family history.  Social History Social History   Tobacco Use   Smoking status: Never   Smokeless tobacco: Never  Vaping Use   Vaping status: Never Used  Substance Use Topics   Alcohol use: No   Drug use: No     Allergies   Patient has no known allergies.   Review of Systems Review of Systems  Constitutional:  Negative for chills and fever.  HENT:  Positive for congestion and sinus pressure. Negative for facial swelling.   Eyes:  Negative for discharge and redness.  Respiratory:  Negative for cough and shortness of breath.   Gastrointestinal:  Negative for abdominal pain, nausea and vomiting.     Physical Exam Triage Vital Signs ED Triage Vitals  Encounter Vitals Group     BP 01/02/24 1607 118/82     Systolic BP Percentile --      Diastolic BP Percentile --      Pulse Rate 01/02/24 1607 (!) 101     Resp 01/02/24  1607 18     Temp 01/02/24 1607 98.3 F (36.8 C)     Temp Source 01/02/24 1607 Oral     SpO2 01/02/24 1607 95 %     Weight --      Height --      Head Circumference --      Peak Flow --      Pain Score 01/02/24 1609 6     Pain Loc --      Pain Education --      Exclude from Growth Chart --    No data found.  Updated Vital Signs BP 118/82 (BP Location: Left Arm)   Pulse (!) 101   Temp 98.3 F (36.8 C) (Oral)   Resp 18   LMP 07/04/2023 (Approximate)   SpO2 95%   Visual Acuity Right Eye Distance:   Left Eye Distance:   Bilateral Distance:    Right Eye Near:   Left Eye Near:    Bilateral Near:     Physical Exam Vitals and nursing note reviewed.  Constitutional:      General: She is not in acute distress.    Appearance: Normal appearance. She is not ill-appearing.  HENT:     Head: Normocephalic and atraumatic.     Nose: Congestion present.     Mouth/Throat:     Mouth: Mucous membranes are moist.     Pharynx:  Oropharynx is clear. No oropharyngeal exudate or posterior oropharyngeal erythema.  Eyes:     Conjunctiva/sclera: Conjunctivae normal.  Cardiovascular:     Rate and Rhythm: Normal rate.  Pulmonary:     Effort: Pulmonary effort is normal. No respiratory distress.  Neurological:     Mental Status: She is alert.  Psychiatric:        Mood and Affect: Mood normal.        Behavior: Behavior normal.        Thought Content: Thought content normal.      UC Treatments / Results  Labs (all labs ordered are listed, but only abnormal results are displayed) Labs Reviewed - No data to display  EKG   Radiology No results found.  Procedures Procedures (including critical care time)  Medications Ordered in UC Medications - No data to display  Initial Impression / Assessment and Plan / UC Course  I have reviewed the triage vital signs and the nursing notes.  Pertinent labs & imaging results that were available during my care of the patient were reviewed by me and considered in my medical decision making (see chart for details).    Augmentin  prescribed to cover sinusitis.  Recommended follow-up if no gradual improvement with any further concerns.  Final Clinical Impressions(s) / UC Diagnoses   Final diagnoses:  Acute maxillary sinusitis, recurrence not specified   Discharge Instructions   None    ED Prescriptions     Medication Sig Dispense Auth. Provider   amoxicillin -clavulanate (AUGMENTIN ) 875-125 MG tablet Take 1 tablet by mouth every 12 (twelve) hours. 14 tablet Billy Asberry FALCON, PA-C      PDMP not reviewed this encounter.   Billy Asberry FALCON, PA-C 01/03/24 [phone]

## 2024-01-04 ENCOUNTER — Emergency Department (HOSPITAL_COMMUNITY)
Admission: EM | Admit: 2024-01-04 | Discharge: 2024-01-04 | Disposition: A | Discharge status: Home / Self Care | Payer: Self-pay | Attending: Emergency Medicine | Admitting: Emergency Medicine

## 2024-01-04 ENCOUNTER — Other Ambulatory Visit: Payer: Self-pay

## 2024-01-04 DIAGNOSIS — K0889 Other specified disorders of teeth and supporting structures: Secondary | ICD-10-CM | POA: Insufficient documentation

## 2024-01-04 DIAGNOSIS — R220 Localized swelling, mass and lump, head: Secondary | ICD-10-CM | POA: Insufficient documentation

## 2024-01-04 MED ORDER — HYDROXYZINE HCL 25 MG PO TABS: 25.0000 mg | ORAL_TABLET | Freq: Four times a day (QID) | ORAL | 0 refills | Status: DC

## 2024-01-04 MED ORDER — KETOROLAC TROMETHAMINE 15 MG/ML IJ SOLN
15.0000 mg | Freq: Once | INTRAMUSCULAR | Status: AC
  Administered 2024-01-04: 15 mg via INTRAMUSCULAR
  Filled 2024-01-04: qty 1

## 2024-01-04 NOTE — ED Provider Notes (Signed)
 [place] EMERGENCY DEPARTMENT AT [facility] Provider Note   CSN: [id number] Arrival date & time: 01/04/24  9075     History  Chief Complaint  Patient presents with   Facial Swelling   Oral Swelling   nose pain    Ebony Ryan is a 33 y.o. female.  33 year old female presents today for concern of dental pain and facial swelling.  Ongoing for the past several days.  Seen at urgent care 2 days ago and started on Augmentin .  Reports compliance with antibiotic and has had 4 doses so far.  Afebrile.  No difficulty swallowing.  She states the facial swelling is persisting.  She is also concerned about small area of swelling that she has surrounding the ear.  The history is provided by the patient. No language interpreter was used.       Home Medications Prior to Admission medications   Medication Sig Start Date End Date Taking? Authorizing Provider  amoxicillin -clavulanate (AUGMENTIN ) 875-125 MG tablet Take 1 tablet by mouth every 12 (twelve) hours. 01/02/24   Billy Asberry FALCON, PA-C  clomiPHENE  (CLOMID ) 50 MG tablet Take 1 tablet (50 mg total) by mouth daily. Take on days 5-9 of your period Patient not taking: Reported on 01/02/2024 03/26/23   Eveline Lynwood MATSU, MD  FOLIC ACID PO Take by mouth daily at 6 (six) AM. Patient not taking: Reported on 01/02/2024    [provider]  medroxyPROGESTERone  (PROVERA ) 10 MG tablet Take 1 tablet (10 mg total) by mouth daily. For 10 days Patient not taking: Reported on 01/02/2024 03/26/23   Eveline Lynwood MATSU, MD      Allergies    Patient has no known allergies.    Review of Systems   Review of Systems  Constitutional:  Negative for chills and fever.  HENT:  Positive for dental problem. Negative for drooling, sore throat, trouble swallowing and voice change.   Respiratory:  Negative for cough.   All other systems reviewed and are negative.   Physical Exam Updated Vital Signs BP 120/84 (BP Location: Right Arm)   Pulse 81    Temp 98.7 F (37.1 C)   Resp 18   LMP 07/04/2023 (Approximate)   SpO2 98%  Physical Exam Vitals and nursing note reviewed.  Constitutional:      General: She is not in acute distress.    Appearance: Normal appearance. She is not ill-appearing.  HENT:     Head: Normocephalic and atraumatic.     Comments: Periauricular lymph node appreciated on the right.    Nose: Nose normal.     Mouth/Throat:     Mouth: Mucous membranes are moist.     Pharynx: No oropharyngeal exudate or posterior oropharyngeal erythema.     Comments: Overall poor dentition.  No obvious dental abscess, retropharyngeal abscess, peritonsillar abscess, Ludwig's angina.  No trismus.  Facial swelling externally noted. Eyes:     Conjunctiva/sclera: Conjunctivae normal.  Cardiovascular:     Rate and Rhythm: Normal rate.  Pulmonary:     Effort: Pulmonary effort is normal. No respiratory distress.  Musculoskeletal:        General: No deformity.  Skin:    Findings: No rash.  Neurological:     Mental Status: She is alert.     ED Results / Procedures / Treatments   Labs (all labs ordered are listed, but only abnormal results are displayed) Labs Reviewed - No data to display  EKG None  Radiology No results found.  Procedures Procedures    Medications Ordered in ED Medications - No data to display  ED Course/ Medical Decision Making/ A&P                                 Medical Decision Making  33 year old female presents today for concern of dental pain and facial swelling.  Recently seen at urgent care.  Symptoms have not improved.  She remains hemodynamically stable.  Exam overall reassuring.  She out a rash that is on her face.  Not has concern of very visible or evident.  Not consistent with an allergic reaction to the antibiotic.  Will give her hydroxyzine  for this.  She will continue on Augmentin .  Given that she has only been on it for 2 days.  No evidence of retropharyngeal abscess, peritonsillar  abscess, Ludwig's angina.  Dental information given.  Strict return precaution discussed.  Discussed symptomatic management with Tylenol  and ibuprofen  for pain control.  Patient voices understanding and is in agreement with plan.   Final Clinical Impression(s) / ED Diagnoses Final diagnoses:  Pain, dental    Rx / DC Orders ED Discharge Orders          Ordered    hydrOXYzine  (ATARAX ) 25 MG tablet  Every 6 hours        01/04/24 1230              Hildegard Loge, PA-C 01/04/24 1231    Freddi Hamilton, MD 01/08/24 1501

## 2024-01-04 NOTE — ED Triage Notes (Addendum)
 Pt/ translator stated, Im having swelling and burning in my nose and burning in my nose and mouth. Went to Dr.2 days ago for throat and was given an antibiotic.

## 2024-01-04 NOTE — ED Notes (Signed)
 Patient discharged by this RN. Patient ambulatory to lobby at time of discharge.

## 2024-01-04 NOTE — Discharge Instructions (Addendum)
 Your exam is overall reassuring.  Continue taking the antibiotic.  You received a shot of Toradol  in the emergency department.  Take 1000 mg of Tylenol  every 8 hours, 600 mg of ibuprofen  every 6-8 hours.  For any emergent symptoms return to the emergency room such as not being able to breathe, drooling, or significant worsening in swelling.  Otherwise please schedule an appointment with a dentist.  Information listed above.  En general, su examen es tranquilizador. Contine tomando el antibitico. Le dieron una inyeccin de Toradol  en el departamento de emergencias. [place] 1000 mg de Tylenol  cada 8 horas, 600 mg de ibuprofeno cada 6-8 horas. Si tiene algn sntoma de associate professor, regrese a la sala de emergencias, como no poder respirar, babeo o un empeoramiento significativo de la hinchazn. De lo contrario, programe una cita con un dentista. La informacin se encuentra en la lista anterior.

## 2024-01-04 NOTE — ED Notes (Signed)
 Per spanish interpreter, pt has been having rash to face and mouth x 3 days. Also reports headache to right side of face and pt states her right side feels swollen. No swelling noted to face. Airway intact. No drooling noted.

## 2024-01-06 ENCOUNTER — Emergency Department (HOSPITAL_COMMUNITY)
Admission: EM | Admit: 2024-01-06 | Discharge: 2024-01-06 | Disposition: A | Discharge status: Home / Self Care | Payer: Self-pay | Attending: Emergency Medicine | Admitting: Emergency Medicine

## 2024-01-06 ENCOUNTER — Encounter (HOSPITAL_COMMUNITY): Payer: Self-pay

## 2024-01-06 ENCOUNTER — Encounter: Payer: Self-pay | Admitting: Physician Assistant

## 2024-01-06 DIAGNOSIS — B029 Zoster without complications: Secondary | ICD-10-CM | POA: Insufficient documentation

## 2024-01-06 DIAGNOSIS — B028 Zoster with other complications: Secondary | ICD-10-CM

## 2024-01-06 MED ORDER — VALACYCLOVIR HCL 500 MG PO TABS
1000.0000 mg | ORAL_TABLET | Freq: Once | ORAL | Status: AC
  Administered 2024-01-06: 1000 mg via ORAL
  Filled 2024-01-06 (×2): qty 2

## 2024-01-06 MED ORDER — TETRACAINE HCL 0.5 % OP SOLN
2.0000 [drp] | Freq: Once | OPHTHALMIC | Status: AC
  Administered 2024-01-06: 2 [drp] via OPHTHALMIC
  Filled 2024-01-06: qty 4

## 2024-01-06 MED ORDER — GABAPENTIN 100 MG PO CAPS: 100.0000 mg | ORAL_CAPSULE | Freq: Three times a day (TID) | ORAL | 0 refills | Status: DC

## 2024-01-06 MED ORDER — FLUORESCEIN SODIUM 1 MG OP STRP
1.0000 | ORAL_STRIP | Freq: Once | OPHTHALMIC | Status: AC
  Administered 2024-01-06: 1 via OPHTHALMIC
  Filled 2024-01-06: qty 1

## 2024-01-06 MED ORDER — VALACYCLOVIR HCL 1 G PO TABS: 1000.0000 mg | ORAL_TABLET | Freq: Three times a day (TID) | ORAL | 0 refills | Status: AC

## 2024-01-06 MED ORDER — OXYCODONE-ACETAMINOPHEN 5-325 MG PO TABS
1.0000 | ORAL_TABLET | Freq: Once | ORAL | Status: AC
  Administered 2024-01-06: 1 via ORAL
  Filled 2024-01-06: qty 1

## 2024-01-06 NOTE — ED Provider Notes (Signed)
 [facility] Provider Note   CSN: [id number] Arrival date & time: 01/06/24  1732     History  Chief Complaint  Patient presents with   Facial Swelling    Ebony Ryan is a 33 y.o. female.  The history is provided by the patient, the spouse and medical records. The history is limited by a language barrier. A language interpreter was used.     33 year old Hispanic speaking female presenting with complaints of facial pain and swelling.  History obtained using language interpreter.  Patient noticing pain and swelling to the right side of her face ongoing for nearly a week.  Pain described as a burning sharp stabbing sensation persistent and is getting progressively worse.  Now she noticed a rash to the affected area.  She also endorsed right eye irritation without vision changes.  She denies fever or chills no severe headache or neck stiffness.  She was seen at urgent care center 4 days ago for her complaint and was prescribed Augmentin  for suspected sinus infection.  She was seen in the ED 2 days later as the symptom did not improve.  She was given hydroxyzine  for suspected allergic reaction and recommend to continue with her Augmentin .  Despite compliant with her medication, her symptoms still persist.  Home Medications Prior to Admission medications   Medication Sig Start Date End Date Taking? Authorizing Provider  amoxicillin -clavulanate (AUGMENTIN ) 875-125 MG tablet Take 1 tablet by mouth every 12 (twelve) hours. 01/02/24   Billy Asberry FALCON, PA-C  clomiPHENE  (CLOMID ) 50 MG tablet Take 1 tablet (50 mg total) by mouth daily. Take on days 5-9 of your period Patient not taking: Reported on 01/02/2024 03/26/23   Eveline Lynwood MATSU, MD  FOLIC ACID PO Take by mouth daily at 6 (six) AM. Patient not taking: Reported on 01/02/2024    [provider]  hydrOXYzine  (ATARAX ) 25 MG tablet Take 1 tablet (25 mg total) by mouth every 6 (six) hours.  01/04/24   Hildegard, Amjad, PA-C  medroxyPROGESTERone  (PROVERA ) 10 MG tablet Take 1 tablet (10 mg total) by mouth daily. For 10 days Patient not taking: Reported on 01/02/2024 03/26/23   Eveline Lynwood MATSU, MD      Allergies    Patient has no known allergies.    Review of Systems   Review of Systems  All other systems reviewed and are negative.   Physical Exam Updated Vital Signs BP 137/87   Pulse (!) 112   Temp 98.5 F (36.9 C)   Resp 18   LMP 07/04/2023 (Approximate)   SpO2 100%  Physical Exam Vitals and nursing note reviewed.  Constitutional:      General: She is not in acute distress.    Appearance: She is well-developed.  HENT:     Head: Atraumatic.     Comments: Vesicular rash noted to right side of face involving the nose and right ear and the roof of the mouth with adjacent facial swelling.  Rash following dermatomal pattern. Eyes:     General: Lids are normal. Lids are everted, no foreign bodies appreciated.        Right eye: No foreign body, discharge or hordeolum.     Extraocular Movements:     Right eye: Normal extraocular motion and no nystagmus.     Conjunctiva/sclera:     Right eye: Right conjunctiva is injected. No chemosis, exudate or hemorrhage.    Pupils: Pupils are equal, round, and reactive to light.  Right eye: No corneal abrasion or fluorescein  uptake.     Slit lamp exam:    Right eye: No corneal flare or corneal ulcer.  Pulmonary:     Effort: Pulmonary effort is normal.  Musculoskeletal:     Cervical back: Neck supple.  Skin:    Findings: No rash.  Neurological:     Mental Status: She is alert.  Psychiatric:        Mood and Affect: Mood normal.     ED Results / Procedures / Treatments   Labs (all labs ordered are listed, but only abnormal results are displayed) Labs Reviewed - No data to display  EKG None  Radiology No results found.  Procedures Procedures    Medications Ordered in ED Medications - No data to display  ED Course/  Medical Decision Making/ A&P                                 Medical Decision Making  BP 137/87   Pulse (!) 112   Temp 98.5 F (36.9 C)   Resp 18   LMP 07/04/2023 (Approximate)   SpO2 100%   37:46 PM  33 year old Hispanic speaking female presenting with complaints of facial pain and swelling.  History obtained using language interpreter.  Patient noticing pain and swelling to the right side of her face ongoing for nearly a week.  Pain described as a burning sharp stabbing sensation persistent and is getting progressively worse.  Now she noticed a rash to the affected area.  She also endorsed right eye irritation without vision changes.  She denies fever or chills no severe headache or neck stiffness.  She was seen at urgent care center 4 days ago for her complaint and was prescribed Augmentin  for suspected sinus infection.  She was seen in the ED 2 days later as the symptom did not improve.  She was given hydroxyzine  for suspected allergic reaction and recommend to continue with her Augmentin .  Despite compliant with her medication, her symptoms still persist.  Exam notable for vesicular rash to right side of face follow dermatomal pattern and concern for shingles.  Her right eye is injected.  Rash also involving the roof of her mouth at the tip of her nose.    Eye exam performed by me fortunately no dendritic lesion involving her eye.  Normal visual acuity.  Plan to discharge patient home with antiviral medication, I will also prescribe patient gabapentin  for neuralgia.        Final Clinical Impression(s) / ED Diagnoses Final diagnoses:  Herpes zoster with complication    Rx / DC Orders ED Discharge Orders          Ordered    valACYclovir  (VALTREX ) 1000 MG tablet  3 times daily        01/06/24 1857    gabapentin  (NEURONTIN ) 100 MG capsule  3 times daily        01/06/24 1857              Nivia Colon, PA-C 01/06/24 1859    Dasie Faden, MD 01/07/24 [id number]

## 2024-01-06 NOTE — Discharge Instructions (Signed)
 You have been diagnosed with shingles.  Please take medication prescribed.  Return if your symptoms worsen.  You may follow-up with an eye specialist if you notice any eye irritation or vision changes.

## 2024-01-06 NOTE — ED Triage Notes (Signed)
 Pt has been seen by regular doctor last Wednesday, and seen on Friday by the ER, for this facial swelling that has been an on-going issue. She was prescribed Abx for this issue this past Wednesday and the ER told her to continue the Abx. She states it has not got any and the swelling is still the same, if not worse. She mentions the pain in her face is a 10/10

## 2024-01-06 NOTE — Progress Notes (Signed)
 ERRONEOUS ENCOUNTER  Patient unable to connect via video or chat

## 2024-01-07 ENCOUNTER — Telehealth: Payer: Self-pay | Admitting: Physician Assistant

## 2024-01-07 DIAGNOSIS — L0100 Impetigo, unspecified: Secondary | ICD-10-CM

## 2024-01-07 DIAGNOSIS — B028 Zoster with other complications: Secondary | ICD-10-CM

## 2024-01-07 MED ORDER — GABAPENTIN 100 MG PO CAPS: 300.0000 mg | ORAL_CAPSULE | Freq: Three times a day (TID) | ORAL | Status: DC

## 2024-01-07 MED ORDER — MUPIROCIN 2 % EX OINT: 1.0000 | TOPICAL_OINTMENT | Freq: Two times a day (BID) | CUTANEOUS | 0 refills | Status: DC

## 2024-01-07 NOTE — Progress Notes (Signed)
 Virtual Visit Consent   Ebony Ryan, you are scheduled for a virtual visit with a [facility] provider today. Just as with appointments in the office, your consent must be obtained to participate. Your consent will be active for this visit and any virtual visit you may have with one of our providers in the next 365 days. If you have a MyChart account, a copy of this consent can be sent to you electronically.  As this is a virtual visit, video technology does not allow for your provider to perform a traditional examination. This may limit your provider's ability to fully assess your condition. If your provider identifies any concerns that need to be evaluated in person or the need to arrange testing (such as labs, EKG, etc.), we will make arrangements to do so. Although advances in technology are sophisticated, we cannot ensure that it will always work on either your end or our end. If the connection with a video visit is poor, the visit may have to be switched to a telephone visit. With either a video or telephone visit, we are not always able to ensure that we have a secure connection.  By engaging in this virtual visit, you consent to the provision of healthcare and authorize for your insurance to be billed (if applicable) for the services provided during this visit. Depending on your insurance coverage, you may receive a charge related to this service.  I need to obtain your verbal consent now. Are you willing to proceed with your visit today? Ebony Ryan has provided verbal consent on 01/07/2024 for a virtual visit (video or telephone). Ebony CHRISTELLA Dickinson, PA-C  Date: 01/07/2024 5:20 PM  Virtual Visit via Video Note   I, Ebony Ryan, connected with  Ebony Ryan  ([id number], [birth year]) on 01/07/24 at  5:00 PM EST by a video-enabled telemedicine application and verified that I am speaking with the correct person using two identifiers. Spanish  Interpreter: [phone]   Location: Patient: Virtual Visit Location Patient: Home Provider: Virtual Visit Location Provider: Home Office   I discussed the limitations of evaluation and management by telemedicine and the availability of in person appointments. The patient expressed understanding and agreed to proceed.    History of Present Illness: Ebony Ryan is a 33 y.o. who identifies as a female who was assigned female at birth, and is being seen today for pain from shingles. Seen in ER last night and diagnosed. Started on Valtrex  and Gabapentin  100mg . Pain had been present for over a week prior to the rash. She had been diagnosed as possible sinus infection and dental infection without relief. The rash finally broke and she presented to the ED. She continues to have significant swelling through the right side of the face. She does report some drainage and crusting at the lesion on the right nasal fold. She does have swelling around the eye, but still denies any changes in vision. Her main complaint is severe head pain. She has been taking Tylenol  1000mg  every 8 hours, Ibuprofen  1000mg  every 8 hours and Gabapentin  100mg  every 8 hours without relief. Reports unable to sleep because even lying down makes her head hurt.    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  For 5-7 days, Disp: 22 g, Rfl: 0   amoxicillin -clavulanate (AUGMENTIN ) 875-125 MG tablet, Take 1 tablet by mouth every 12 (twelve) hours., Disp: 14 tablet, Rfl: 0   clomiPHENE  (CLOMID ) 50 MG tablet, Take 1 tablet (50 mg total) by mouth daily. Take on days 5-9 of your period (Patient not taking: Reported on 01/02/2024), Disp: 10 tablet, Rfl: 1   FOLIC ACID PO, Take by mouth daily at  6 (six) AM. (Patient not taking: Reported on 01/02/2024), Disp: , Rfl:    gabapentin  (NEURONTIN ) 100 MG capsule, Take 1 capsule (100 mg total) by mouth 3 (three) times daily., Disp: 60 capsule, Rfl: 0   hydrOXYzine  (ATARAX ) 25 MG tablet, Take 1 tablet (25 mg total) by mouth every 6 (six) hours., Disp: 12 tablet, Rfl: 0   medroxyPROGESTERone  (PROVERA ) 10 MG tablet, Take 1 tablet (10 mg total) by mouth daily. For 10 days (Patient not taking: Reported on 01/02/2024), Disp: 20 tablet, Rfl: 1   valACYclovir  (VALTREX ) 1000 MG tablet, Take 1 tablet (1,000 mg total) by mouth 3 (three) times daily., Disp: 21 tablet, Rfl: 0  Observations/Objective: Patient is well-developed, well-nourished in no acute distress.  Resting comfortably at home.  Head is normocephalic, atraumatic.  No labored breathing.  Speech is clear and coherent with logical content.  Patient is alert and oriented at baseline.  Significant swelling on the right side of the face with crusted over vesicular lesions from the right nasal fold, over the cheek to the temporal area in a dermatomal pattern. Largest crusted lesion with surrounding erythema at the right nasal fold  Assessment and Plan: 1. Herpes zoster with other complication (Primary)  2. Impetigo - mupirocin  ointment (BACTROBAN ) 2 %; Apply 1 Application topically 2 (two) times daily. For 5-7 days  Dispense: 22 g; Refill: 0  - Worsening post herpetic pain - Continue Valacyclovir  1000mg  three times daily - Increase Gabapentin  to 300mg  (3 capsules) three times daily (every 8 hours) - Continue Tylenol  1000mg  and Ibuprofen  800mg  every 8 hours (can alternate these 2 medications every 4 hours) - Cold compresses to face for pain and swelling - Mupirocin  for wound at nose for possible secondary infection - Strict ER precautions if pain does not improve, if symptoms worsen at all, or if develops any changes to vision, or any hearing loss in the right ear develops  Follow Up  Instructions: I discussed the assessment and treatment plan with the patient. The patient was provided an opportunity to ask questions and all were answered. The patient agreed with the plan and demonstrated an understanding of the instructions.  A copy of instructions were sent to the patient via MyChart unless otherwise noted below.    The patient was advised to call back or seek an in-person evaluation if the symptoms worsen or if the condition fails to improve as anticipated.    Ebony CHRISTELLA Dickinson, PA-C

## 2024-01-07 NOTE — Patient Instructions (Signed)
 Betsie Mabel everitt Bellow, thank you for joining Delon CHRISTELLA Dickinson, PA-C for today's virtual visit.  While this provider is not your primary care provider (PCP), if your PCP is located in our provider database this encounter information will be shared with them immediately following your visit.   A [facility] MyChart account gives you access to today's visit and all your visits, tests, and labs performed at [facility]  click here if you don't have a [facility] MyChart account or go to [email or web address]  Consent: (Patient) Ebony Ryan provided verbal consent for this virtual visit at the beginning of the encounter.  Current Medications:  Current Outpatient Medications:    mupirocin  ointment (BACTROBAN ) 2 %, Apply 1 Application topically 2 (two) times daily. For 5-7 days, Disp: 22 g, Rfl: 0   clomiPHENE  (CLOMID ) 50 MG tablet, Take 1 tablet (50 mg total) by mouth daily. Take on days 5-9 of your period (Patient not taking: Reported on 01/02/2024), Disp: 10 tablet, Rfl: 1   FOLIC ACID PO, Take by mouth daily at 6 (six) AM. (Patient not taking: Reported on 01/02/2024), Disp: , Rfl:    gabapentin  (NEURONTIN ) 100 MG capsule, Take 3 capsules (300 mg total) by mouth 3 (three) times daily., Disp: , Rfl:    hydrOXYzine  (ATARAX ) 25 MG tablet, Take 1 tablet (25 mg total) by mouth every 6 (six) hours., Disp: 12 tablet, Rfl: 0   medroxyPROGESTERone  (PROVERA ) 10 MG tablet, Take 1 tablet (10 mg total) by mouth daily. For 10 days (Patient not taking: Reported on 01/02/2024), Disp: 20 tablet, Rfl: 1   valACYclovir  (VALTREX ) 1000 MG tablet, Take 1 tablet (1,000 mg total) by mouth 3 (three) times daily., Disp: 21 tablet, Rfl: 0   Medications ordered in this encounter:  Meds ordered this encounter  Medications   mupirocin  ointment (BACTROBAN ) 2 %    Sig: Apply 1 Application topically 2 (two) times daily. For 5-7 days    Dispense:  22 g    Refill:  0    Supervising Provider:    BLAISE ALEENE KIDD [[id number]]   gabapentin  (NEURONTIN ) 100 MG capsule    Sig: Take 3 capsules (300 mg total) by mouth 3 (three) times daily.    Supervising Provider:   BLAISE ALEENE KIDD [[id number]]     *If you need refills on other medications prior to your next appointment, please contact your pharmacy*  Follow-Up: Call back or seek an in-person evaluation if the symptoms worsen or if the condition fails to improve as anticipated.  [facility] [phone]  Other Instructions  Culebrilla Shingles  La culebrilla, o herpes zster, es una infeccin. Produce una erupcin cutnea y ampollas. Estas zonas infectadas pueden doler mucho. La culebrilla solo ocurre si: Ha tenido varicela. Le han aplicado una inyeccin llamada vacuna para protegerlo de la varicela. La culebrilla es poco frecuente en este caso. Cules son las causas? La culebrilla es causada por un germen llamado virus de la varicela zster. Este es el mismo germen que causa la varicela. Despus de estar expuesto al germen, este permanece en el cuerpo, pero est latente. Esto significa que no est activo. La culebrilla se produce si el germen se vuelve activo nuevamente. Esto puede ocurrir aos despus de la primera exposicin al germen. Qu incrementa el riesgo? Es ms probable que tenga culebrilla si: Es mayor de 60 aos. Est bajo mucho estrs. Tiene un sistema inmunitario dbil. El sistema inmunitario es el sistema de defensa de su cuerpo.  Puede estar dbil si la persona: Tiene el virus de inmunodeficiencia humana (VIH). Tiene sndrome de inmunodeficiencia adquirida (sida). Tiene cncer. Toma medicamentos que debilitan el sistema inmunitario. Estos incluyen los medicamentos para el trasplante de rganos. Cules son los signos o sntomas? Los primeros sntomas de la culebrilla pueden ser picazn, hormigueo o engineer, mining. Es posible que sienta que le arde la piel. Unos das o semanas ms tarde, tendr una erupcin  cutnea. Esto es lo que puede esperar: Es probable que la erupcin aparezca en un solo lado del cuerpo. La erupcin puede tener forma de cinturn o cinta. Con el tiempo, se convertir en ampollas llenas de lquido. Las ampollas se rompern y se forensic scientist. Las costras se secarn en unas 2 o 3 semanas. Tambin puede tener: Lajune. Escalofros. Dolor de cabeza. Nuseas. Cmo se diagnostica? La culebrilla se diagnostica con un examen de la piel. Se puede tomar una muestra llamada cultivo de una de las ampollas y enviarla a un laboratorio. Esto indicar si tiene culebrilla. Cmo se trata? La erupcin cutnea puede durar varias semanas. No hay cura para la culebrilla, pero el mdico puede darle medicamentos. Estos medicamentos pueden hacer lo siguiente: Ayudar a engineer, materials. Ayudar a la picazn. Ayudar con la irritacin y la hinchazn. Lograr una mejora ms pronto. Prevenir problemas a largo plazo. Si la erupcin est en la cara, es posible que deba consultar a un oculista o a un mdico especialista en nariz, garganta y odo (otorrinolaringlogo). Siga estas instrucciones en su casa: Medicamentos Use los medicamentos solamente como se lo haya indicado el mdico. Use una crema para calmar la picazn o cremas anestsicas en la erupcin cutnea o las ampollas segn las indicaciones del mdico. Para aliviar la picazn y las molestias  Para ayudar a la picazn: Coloque paos fros y hmedos, llamados compresas fras, sobre la erupcin cutnea o las ampollas. Tome un bao fro. Pruebe agregar bicarbonato de sodio o avena seca en el agua. No se bae con agua caliente. Use una locin de calamina en la erupcin cutnea o las ampollas. Puede conseguir este tipo de locin en la tienda. Cuidado de las ampollas y la erupcin cutnea Mantenga la zona de la erupcin cutnea cubierta con una venda floja. Use ropa holgada que no roce contra la erupcin. Cudese la erupcin cutnea  como se lo haya indicado el mdico. Asegrese de hacer lo siguiente: Lvese las manos con agua y jabn durante al menos 20 segundos antes y despus de cambiarse la venda. Si no dispone de agua y jabn, use un desinfectante para manos. Mantenga la erupcin cutnea y las ampollas limpias lavando la zona con jabn suave y agua fra. Cmbiese la venda. Verifique la erupcin cutnea todos los das para detectar signos de infeccin. Est atento a los siguientes signos: Aumento del enrojecimiento, la hinchazn o chief technology officer. Lquido o sangre. Calor. Pus o mal olor. No se rasque el rea de la erupcin. No se toque las ampollas. Para evitar rascarse: Tenga las uas siempre cortas y limpias. Trate de usar guantes o unitedhealth duerme. Instrucciones generales Descanso. Lvese las manos frecuentemente con agua y jabn durante al menos 20 segundos. Si no dispone de agua y jabn, use un desinfectante para manos. Lavarse las manos reduce la probabilidad de contraer una infeccin en la piel. Su infeccin puede causar varicela en otras personas. Si tiene air products and chemicals an no son costras, euel spangle de: Los bebs. Las Flora. Los nios con eczema. Personas de [employer]  tienen un trasplante de rgano. Las eli lilly and company tienen una enfermedad de larga duracin, o engineering geologist. Las personas que no hayan tenido varicela antes. Las personas que no hayan recibido la vacuna transport planner. Cmo se previene? Las vacunas son la geneticist, molecular de evitar que contraiga varicela o culebrilla. Hable con su mdico sobre la aplicacin de estas vacunas. Dnde obtener ms informacin Centers for Disease Control and Prevention INSURANCE CLAIMS HANDLER) (Centros para el Control y la Prevencin de Event Organiser): footballexhibition.com.br Comunquese con un mdico si: El dolor no mejora con los medicamentos. El dolor no mejora despus de que se cura la erupcin cutnea. Tiene signos de infeccin alrededor de la erupcin cutnea. La  erupcin cutnea o las ampollas empeoran. Tiene fiebre o escalofros. Solicite ayuda de inmediato si: La erupcin cutnea est en el rostro o en la nariz. Tiene dolor en el rostro o cerca de los ojos. Pierde la sensacin en un lado del rostro. Tiene dificultad para ver. Siente dolor o zumbido en el odo. Esta informacin no tiene theme park manager el consejo del mdico. Asegrese de hacerle al mdico cualquier pregunta que tenga. Document Revised: 03/22/2023 Document Reviewed: 03/22/2023 Elsevier Patient Education  2024 Elsevier Inc.    If you have been instructed to have an in-person evaluation today at a local Urgent Care facility, please use the link below. It will take you to a list of all of our available [facility], including address, phone number and hours of operation. Please do not delay care.  [facility]  If you or a family member do not have a primary care provider, use the link below to schedule a visit and establish care. When you choose a [facility] primary care physician or advanced practice provider, you gain a long-term partner in health. Find a Primary Care Provider  Learn more about [facility]'s in-office and virtual care options: [facility] - Get Care Now

## 2024-01-17 ENCOUNTER — Telehealth: Payer: Self-pay | Admitting: Physician Assistant

## 2024-01-17 DIAGNOSIS — B0229 Other postherpetic nervous system involvement: Secondary | ICD-10-CM

## 2024-01-17 MED ORDER — GABAPENTIN 300 MG PO CAPS: 300.0000 mg | ORAL_CAPSULE | Freq: Two times a day (BID) | ORAL | 0 refills | Status: AC

## 2024-01-17 NOTE — Patient Instructions (Signed)
Emali Norva Pavlov, thank you for joining Piedad Climes, PA-C for today's virtual visit.  While this provider is not your primary care provider (PCP), if your PCP is located in our provider database this encounter information will be shared with them immediately following your visit.   A [facility] MyChart account gives you access to today's visit and all your visits, tests, and labs performed at [facility] " click here if you don't have a [facility] MyChart account or go to [email or web address]  Consent: (Patient) Candida mersades urbaniak provided verbal consent for this virtual visit at the beginning of the encounter.  Current Medications:  Current Outpatient Medications:    clomiPHENE (CLOMID) 50 MG tablet, Take 1 tablet (50 mg total) by mouth daily. Take on days 5-9 of your period (Patient not taking: Reported on 01/02/2024), Disp: 10 tablet, Rfl: 1   FOLIC ACID PO, Take by mouth daily at 6 (six) AM. (Patient not taking: Reported on 01/02/2024), Disp: , Rfl:    gabapentin (NEURONTIN) 100 MG capsule, Take 3 capsules (300 mg total) by mouth 3 (three) times daily., Disp: , Rfl:    hydrOXYzine (ATARAX) 25 MG tablet, Take 1 tablet (25 mg total) by mouth every 6 (six) hours., Disp: 12 tablet, Rfl: 0   medroxyPROGESTERone (PROVERA) 10 MG tablet, Take 1 tablet (10 mg total) by mouth daily. For 10 days (Patient not taking: Reported on 01/02/2024), Disp: 20 tablet, Rfl: 1   mupirocin ointment (BACTROBAN) 2 %, Apply 1 Application topically 2 (two) times daily. For 5-7 days, Disp: 22 g, Rfl: 0   valACYclovir (VALTREX) 1000 MG tablet, Take 1 tablet (1,000 mg total) by mouth 3 (three) times daily., Disp: 21 tablet, Rfl: 0   Medications ordered in this encounter:  No orders of the defined types were placed in this encounter.    *If you need refills on other medications prior to your next appointment, please contact your pharmacy*  Follow-Up: Call back or seek an in-person  evaluation if the symptoms worsen or if the condition fails to improve as anticipated.  [facility] [phone]  Other Instructions Please keep skin clean and dry. Take the Gabapentin as directed for nerve pain. Use OTC Sarna lotion to help with itching around the healing scabs. IF symptoms are not continuing to resolve over the next two weeks, or if there are any new or worsening symptoms, you need to be evaluated in person.   Dolor nervioso posterior a la culebrilla Nerve Pain After Shingles La neuralgia postherptica (NPH) es el dolor nervioso que se puede sentir despus de tener la [place]. La culebrilla es una infeccin que produce una erupcin cutnea y ampollas dolorosas. Es causada por el mismo microbio que produce la varicela. La NPH afecta la zona del cuerpo donde tuvo la erupcin cutnea de la culebrilla. Puede durar 3 meses despus de que la erupcin haya desaparecido. Cules son las causas? La causa de la NPH puede ser el [employer] nervios. Este dao puede deberse a la hinchazn que produce la infeccin de la culebrilla. Qu incrementa el riesgo? Es ms probable que desarrolle NPH si: Es mayor de 68 aos. Siente dolor intenso antes de que aparezca la erupcin cutnea. Tiene una erupcin muy severa. Tiene Willy Eddy y alrededor del ojo. Su sistema de defensa del cuerpo (sistema inmunitario) est debilitado. Cules son los signos o sntomas? El sntoma principal de la NPH es Chief Technology Officer. El dolor puede tener las siguientes caractersticas:  Ser punzante, urente o agudo. Sentirse como Customer service manager. Puede ser intermitente o [place]. Empeora si: Algo le toca la piel. Sube o baja la temperatura. Tambin puede tener picazn. Cmo se diagnostica? La NPH puede diagnosticarse en funcin de lo siguiente: Sus sntomas. Si ha tenido [employer]. Cmo se trata? No hay Marval Regal, pero el tratamiento puede ayudar a Human resources officer. Es  posible que los analgsicos normales no brinden [place]. Es posible que deba trabajar con un experto en el tratamiento del dolor para encontrar el que mejor acte en usted. El tratamiento puede incluir: Medicamentos anticonvulsivos. Antidepresivos. Analgsicos fuertes. Un parche anestsico que se coloca sobre la piel. Inyecciones de: Anestsicos. Medicamentos para tratar la inflamacin. Toxina botulnica. Esta puede bloquear las seales de dolor e impedir que Stage manager. Siga estas indicaciones en su casa: Medicamentos Use los medicamentos de venta libre y los recetados solamente como se lo haya indicado el mdico. Pregntele al mdico si el medicamento que le recet: Requiere que evite conducir o usar [place]. Puede causarle dificultad para defecar o estreimiento. Es posible que deba tomar medidas para prevenir o tratar los problemas para defecar: Product manager suficiente lquido para Radio producer pis (orina) de color amarillo plido. Usar medicamentos recetados o de Sales promotion account executive. Consumir alimentos ricos en fibra, como frijoles, cereales integrales, y frutas y verduras frescas. Limitar el consumo de alimentos ricos en grasa y azcares procesados, como los alimentos fritos o dulces. Control del dolor  Si se lo indican, aplique hielo sobre la zona dolorida. Ponga el hielo en una bolsa plstica. Coloque una toalla entre la piel y Copy. Aplique el hielo durante 20 minutos, 2 o 3 veces por da. Si la piel se le pone de color rojo brillante, retire el hielo de inmediato para evitar daos en la piel. El [place] de dao es mayor si no puede sentir dolor, Airline pilot o fro. [place] las zonas sensibles con una venda, o vendaje, para evitar que la ropa las roce. Use ropa holgada. Indicaciones generales Puede tardar mucho tiempo en mejorar. Trabaje en estrecha colaboracin con el mdico. Considere hablar con especialista de salud mental. Puede ayudarlo a encontrar formas de sobrellevar la sensacin de  agobio o desesperanza. Tenga un buen sistema de Immunologist. Considere participar en un grupo de apoyo para Chief Technology Officer. Cmo se previene? Las vacunas son la mejor manera de prevenir la culebrilla y la NPH. Debe aplicarse la vacuna contra la culebrilla una vez que tenga ms de 50 aos. Hable con su mdico sobre la aplicacin de la vacuna. Comunquese con un mdico si: Los medicamentos no resultan eficaces. No puede controlar el dolor en su casa. Se siente triste o deprimido. Solicite ayuda de inmediato si: Tiene pensamientos acerca de Runner, broadcasting/film/video o Physicist, medical a Economist. Busque ayuda de inmediato si alguna vez siente que puede hacerse dao a usted mismo o a otros, o tiene pensamientos de Patent examiner a su vida. Dirjase al centro de urgencias ms cercano o: Llame al 911. Llame a National Suicide Prevention Lifeline (Lnea Telefnica Nacional para la Prevencin del Suicidio) al 9806152924 o al 988. Est disponible las 24 horas del da. Enve un mensaje de texto a la lnea para casos de crisis al 9543143905. Esta informacin no tiene Theme park manager el consejo del mdico. Asegrese de hacerle al mdico cualquier pregunta que tenga. Document Revised: 04/20/2023 Document Reviewed: 04/20/2023 Elsevier Patient Education  2024 [employer].   If you have been instructed to have an in-person  evaluation today at a local Urgent Care facility, please use the link below. It will take you to a list of all of our available [facility], including address, phone number and hours of operation. Please do not delay care.  [facility]  If you or a family member do not have a primary care provider, use the link below to schedule a visit and establish care. When you choose a [facility] primary care physician or advanced practice provider, you gain a long-term partner in health. Find a Primary Care Provider  Learn more about [facility] in-office and virtual care  options: [facility] - Get Care Now

## 2024-01-17 NOTE — Progress Notes (Signed)
Virtual Visit Consent   Cherae saniaa hoschar, you are scheduled for a virtual visit with a [facility] provider today. Just as with appointments in the office, your consent must be obtained to participate. Your consent will be active for this visit and any virtual visit you may have with one of our providers in the next 365 days. If you have a MyChart account, a copy of this consent can be sent to you electronically.  As this is a virtual visit, video technology does not allow for your provider to perform a traditional examination. This may limit your provider's ability to fully assess your condition. If your provider identifies any concerns that need to be evaluated in person or the need to arrange testing (such as labs, EKG, etc.), we will make arrangements to do so. Although advances in technology are sophisticated, we cannot ensure that it will always work on either your end or our end. If the connection with a video visit is poor, the visit may have to be switched to a telephone visit. With either a video or telephone visit, we are not always able to ensure that we have a secure connection.  By engaging in this virtual visit, you consent to the provision of healthcare and authorize for your insurance to be billed (if applicable) for the services provided during this visit. Depending on your insurance coverage, you may receive a charge related to this service.  I need to obtain your verbal consent now. Are you willing to proceed with your visit today? Ebony Ryan has provided verbal consent on 01/17/2024 for a virtual visit (video or telephone). Piedad Climes, [place]  Date: 01/17/2024 3:01 PM  Virtual Visit via Video Note   I, Piedad Climes, connected with  Ebony Ryan  ([id number], [birth year]) on 01/17/24 at  2:45 PM EST by a video-enabled telemedicine application and verified that I am speaking with the correct person using two  identifiers.  Location: Patient: Virtual Visit Location Patient: Home Provider: Virtual Visit Location Provider: Home Office Interpretor: Caregility [phone]   I discussed the limitations of evaluation and management by telemedicine and the availability of in person appointments. The patient expressed understanding and agreed to proceed.    History of Present Illness: Ebony Ryan is a 33 y.o. who identifies as a female who was assigned female at birth, and is being seen today for some continued nerve pain after recent treatment for shingles rash. Completed course of Valtrex and Gabapentin initially given. Notes resolution of blistering rash, all scabbed over, as well as associated swelling. Still noting residual itching and occasional stinging pain. Denies vision changes or decreased in EOM of R eye.  HPI: HPI  Problems:  Patient Active Problem List   Diagnosis Date Noted   Female infertility associated with anovulation 03/26/2023    Allergies: No Known Allergies Medications:  Current Outpatient Medications:    gabapentin (NEURONTIN) 300 MG capsule, Take 1 capsule (300 mg total) by mouth 2 (two) times daily., Disp: 30 capsule, Rfl: 0   valACYclovir (VALTREX) 1000 MG tablet, Take 1 tablet (1,000 mg total) by mouth 3 (three) times daily., Disp: 21 tablet, Rfl: 0  Observations/Objective: Patient is well-developed, well-nourished in no acute distress.  Resting comfortably at home.  Head is normocephalic, atraumatic.  No labored breathing. Speech is clear and coherent with logical content.  Patient is alert and oriented at baseline.  Prior shingles rash of R face now completely scabbed over and  in process of healing. R conjunctiva within normal limits. No lid swelling. Pupils are equal and round bilaterally. EOMI.  Assessment and Plan: 1. Post herpetic neuralgia (Primary) - gabapentin (NEURONTIN) 300 MG capsule; Take 1 capsule (300 mg total) by mouth 2 (two) times daily.   Dispense: 30 capsule; Refill: 0  Mild persistent nerve sensitivity and discomfort. Will restart gabapentin at 300 mg BID for the next 10 days. If pain continues to persist or any new/worsening symptoms, will need an in-person evaluation ASAP.  Follow Up Instructions: I discussed the assessment and treatment plan with the patient. The patient was provided an opportunity to ask questions and all were answered. The patient agreed with the plan and demonstrated an understanding of the instructions.  A copy of instructions were sent to the patient via MyChart unless otherwise noted below.   The patient was advised to call back or seek an in-person evaluation if the symptoms worsen or if the condition fails to improve as anticipated.    Piedad Climes, PA-C

## 2024-02-07 ENCOUNTER — Encounter: Payer: Self-pay | Admitting: Obstetrics & Gynecology

## 2024-02-07 ENCOUNTER — Other Ambulatory Visit (HOSPITAL_COMMUNITY)
Admission: RE | Admit: 2024-02-07 | Discharge: 2024-02-07 | Disposition: A | Discharge status: Home / Self Care | Payer: MEDICAID | Source: Ambulatory Visit | Attending: Obstetrics & Gynecology | Admitting: Obstetrics & Gynecology

## 2024-02-07 ENCOUNTER — Ambulatory Visit (INDEPENDENT_AMBULATORY_CARE_PROVIDER_SITE_OTHER): Payer: Self-pay | Admitting: Obstetrics & Gynecology

## 2024-02-07 VITALS — BP 137/81 | HR 85 | Wt 170.0 lb

## 2024-02-07 DIAGNOSIS — N970 Female infertility associated with anovulation: Secondary | ICD-10-CM

## 2024-02-07 DIAGNOSIS — Z3169 Encounter for other general counseling and advice on procreation: Secondary | ICD-10-CM | POA: Insufficient documentation

## 2024-02-07 MED ORDER — MEDROXYPROGESTERONE ACETATE 10 MG PO TABS: 10.0000 mg | ORAL_TABLET | Freq: Every day | ORAL | 2 refills | Status: AC

## 2024-02-07 MED ORDER — CLOMIPHENE CITRATE 50 MG PO TABS: 100.0000 mg | ORAL_TABLET | Freq: Every day | ORAL | 2 refills | Status: AC

## 2024-02-07 NOTE — Progress Notes (Signed)
 Patient ID: Ebony Ryan, female   DOB: 04/01/1991, 33 y.o.   MRN: [id number]  Chief Complaint  Patient presents with   Gynecologic Exam    HPI Ebony Ryan is a 33 y.o. female.  [id number] She returns to discuss infertility and irregular cycles.She had withdrawal bleeding after using Provera and clomiphene last spring and LMP was 06/2023. She had light bleeding for 2 days last month. She still wants to conceive. Her spouse is her 39 y/o daughter's father. He has not had a semen analysis. HPI  Past Medical History:  Diagnosis Date   Appendicitis    2014   Depression    Ectopic pregnancy    02/2013    Past Surgical History:  Procedure Laterality Date   LAPAROSCOPIC APPENDECTOMY N/A 07/03/2013   Procedure: APPENDECTOMY LAPAROSCOPIC;  Surgeon: Emelia Loron, MD;  [facility];  Service: General;  Laterality: N/A;    No family history on file.  Social History Social History   Tobacco Use   Smoking status: Never   Smokeless tobacco: Never  Vaping Use   Vaping status: Never Used  Substance Use Topics   Alcohol use: No   Drug use: No    No Known Allergies  Current Outpatient Medications  Medication Sig Dispense Refill   gabapentin (NEURONTIN) 300 MG capsule Take 1 capsule (300 mg total) by mouth 2 (two) times daily. 30 capsule 0   clomiPHENE (CLOMID) 50 MG tablet Take 2 tablets (100 mg total) by mouth daily. Take on days 5-9 of your period 20 tablet 2   medroxyPROGESTERone (PROVERA) 10 MG tablet Take 1 tablet (10 mg total) by mouth daily. For 10 days 20 tablet 2   valACYclovir (VALTREX) 1000 MG tablet Take 1 tablet (1,000 mg total) by mouth 3 (three) times daily. (Patient not taking: Reported on 02/07/2024) 21 tablet 0   No current facility-administered medications for this visit.    Review of Systems Review of Systems  Constitutional: Negative.   Respiratory: Negative.    Cardiovascular: Negative.   Gastrointestinal: Negative.    Genitourinary:  Positive for vaginal discharge. Negative for dysuria and vaginal bleeding. Menstrual problem: amenorrhea.   Blood pressure 137/81, pulse 85, weight 170 lb (77.1 kg), last menstrual period 01/23/2024, unknown if currently breastfeeding.  Physical Exam Physical Exam Vitals and nursing note reviewed. Exam conducted with a chaperone present (Spanish interpreter [employer]).  Constitutional:      Appearance: Normal appearance.  HENT:     Head: Normocephalic and atraumatic.  Cardiovascular:     Rate and Rhythm: Normal rate.  Pulmonary:     Effort: Pulmonary effort is normal.  Neurological:     General: No focal deficit present.     Mental Status: She is alert.  Psychiatric:        Mood and Affect: Mood normal.        Behavior: Behavior normal.     Data Reviewed   Assessment Female infertility associated with anovulation  Infertility counseling - Plan: Cervicovaginal ancillary only, clomiPHENE (CLOMID) 50 MG tablet, medroxyPROGESTERone (PROVERA) 10 MG tablet She had negative ovulation indicators  Plan Meds ordered this encounter  Medications   clomiPHENE (CLOMID) 50 MG tablet    Sig: Take 2 tablets (100 mg total) by mouth daily. Take on days 5-9 of your period    Dispense:  20 tablet    Refill:  2   medroxyPROGESTERone (PROVERA) 10 MG tablet    Sig: Take 1 tablet (10 mg total)  by mouth daily. For 10 days    Dispense:  20 tablet    Refill:  2    RTC 6 months. Advised to have her husband get semen analysis   Ebony Ryan 02/07/2024, 11:07 AM

## 2024-02-08 LAB — CERVICOVAGINAL ANCILLARY ONLY
Bacterial Vaginitis (gardnerella): NEGATIVE
Candida Glabrata: NEGATIVE
Candida Vaginitis: POSITIVE — AB
Chlamydia: NEGATIVE
Comment: NEGATIVE
Comment: NEGATIVE
Comment: NEGATIVE
Comment: NEGATIVE
Comment: NEGATIVE
Comment: NORMAL
Neisseria Gonorrhea: NEGATIVE
Trichomonas: NEGATIVE

## 2024-02-15 ENCOUNTER — Encounter: Payer: Self-pay | Admitting: Obstetrics & Gynecology

## 2024-02-15 MED ORDER — FLUCONAZOLE 150 MG PO TABS
150.0000 mg | ORAL_TABLET | Freq: Once | ORAL | 0 refills | Status: AC
Start: 2024-02-15 — End: 2024-02-15

## 2024-02-15 NOTE — Addendum Note (Signed)
 Addended by: Adam Phenix on: 02/15/2024 11:46 AM   Modules accepted: Orders

## 2024-03-05 ENCOUNTER — Other Ambulatory Visit: Payer: Self-pay | Admitting: *Deleted

## 2024-03-05 DIAGNOSIS — Z124 Encounter for screening for malignant neoplasm of cervix: Secondary | ICD-10-CM

## 2024-03-05 NOTE — Progress Notes (Signed)
 PAP record has been requested from [facility].

## 2024-03-05 NOTE — Patient Instructions (Signed)
 Marland Kitchen

## 2024-03-05 NOTE — Progress Notes (Signed)
 Patient: Ebony Ryan           Date of Birth: January 14, 1991           MRN: [id number] Visit Date: 03/05/2024 PCP: Patient, No Pcp Per  Cervical Cancer Screening Do you smoke?: No Have you ever had or been told you have an allergy to latex products?: No Marital status: Married Date of last pap smear: 2-5 yrs ago Date of last menstrual period:  (July 2024) Have you ever had any of the following? Hysterectomy: No Tubal ligation (tubes tied): No Abnormal bleeding: Yes (Irregular menstrual) Abnormal pap smear: No Venereal warts: No A sex partner with venereal warts: No A high risk* sex partner: No  Cervical Exam  Abnormal Observations: Cervix slightly reddened around os. Cervix friable. Recommendations: Last Pap smear was 2-3 years ago at the [facility] and normal per patient. Per patient has no history of an abnormal Pap smear. No Pap smear results are available in EPIC. Let patient know if today's Pap smear is normal and HPV negative that her next Pap smear will be due in 5 years. Informed patient that will follow up with her within the next couple of weeks with results of her Pap smear by letter or phone. Patient verbalized understanding.      Spanish interpreter Natale Lay from [facility] provided.  Patient's History Patient Active Problem List   Diagnosis Date Noted   Infertility counseling 02/07/2024   Past Medical History:  Diagnosis Date   Appendicitis    2014   Depression    Ectopic pregnancy    02/2013    No family history on file.  Social History   Occupational History   Not on file  Tobacco Use   Smoking status: Never   Smokeless tobacco: Never  Vaping Use   Vaping status: Never Used  Substance and Sexual Activity   Alcohol use: No   Drug use: No   Sexual activity: Yes    Birth control/protection: None

## 2024-03-06 ENCOUNTER — Encounter: Payer: Self-pay | Admitting: Obstetrics & Gynecology

## 2024-03-06 LAB — CYTOLOGY - PAP
Comment: NEGATIVE
Diagnosis: NEGATIVE
High risk HPV: NEGATIVE
